# Patient Record
Sex: Female | Born: 1990 | Race: Black or African American | Hispanic: No | Marital: Single | Smoking: Former smoker
Health system: Southern US, Community
[De-identification: ages and names within clinical notes are randomized; demographics above are authoritative.]

## PROBLEM LIST (undated history)

## (undated) ENCOUNTER — Encounter
Payer: MEDICARE | Attending: Student in an Organized Health Care Education/Training Program | Primary: Student in an Organized Health Care Education/Training Program

## (undated) ENCOUNTER — Non-Acute Institutional Stay: Payer: MEDICARE | Attending: Family Medicine | Primary: Family Medicine

## (undated) ENCOUNTER — Telehealth

## (undated) ENCOUNTER — Encounter

## (undated) ENCOUNTER — Ambulatory Visit: Payer: MEDICARE | Attending: Registered" | Primary: Registered"

## (undated) ENCOUNTER — Encounter
Attending: Student in an Organized Health Care Education/Training Program | Primary: Student in an Organized Health Care Education/Training Program

## (undated) ENCOUNTER — Ambulatory Visit

## (undated) ENCOUNTER — Telehealth: Attending: Clinical | Primary: Clinical

## (undated) ENCOUNTER — Telehealth: Payer: MEDICARE

## (undated) ENCOUNTER — Ambulatory Visit: Payer: MEDICARE

## (undated) ENCOUNTER — Ambulatory Visit: Payer: Medicare (Managed Care)

## (undated) ENCOUNTER — Encounter: Payer: MEDICARE | Attending: Family | Primary: Family

## (undated) ENCOUNTER — Encounter: Attending: Family | Primary: Family

## (undated) ENCOUNTER — Telehealth
Attending: Student in an Organized Health Care Education/Training Program | Primary: Student in an Organized Health Care Education/Training Program

## (undated) ENCOUNTER — Encounter: Attending: Clinical | Primary: Clinical

## (undated) ENCOUNTER — Encounter: Payer: MEDICARE | Attending: Psychiatry | Primary: Psychiatry

## (undated) ENCOUNTER — Encounter: Payer: MEDICARE | Attending: Family Medicine | Primary: Family Medicine

## (undated) ENCOUNTER — Encounter: Attending: Psychiatry | Primary: Psychiatry

## (undated) ENCOUNTER — Non-Acute Institutional Stay
Payer: MEDICARE | Attending: Rehabilitative and Restorative Service Providers" | Primary: Rehabilitative and Restorative Service Providers"

## (undated) ENCOUNTER — Encounter: Payer: MEDICARE | Attending: Registered" | Primary: Registered"

## (undated) ENCOUNTER — Encounter: Payer: MEDICARE | Attending: Nutritionist | Primary: Nutritionist

## (undated) ENCOUNTER — Ambulatory Visit
Attending: Student in an Organized Health Care Education/Training Program | Primary: Student in an Organized Health Care Education/Training Program

## (undated) ENCOUNTER — Non-Acute Institutional Stay
Payer: MEDICARE | Attending: Student in an Organized Health Care Education/Training Program | Primary: Student in an Organized Health Care Education/Training Program

## (undated) ENCOUNTER — Encounter
Payer: MEDICARE | Attending: Rehabilitative and Restorative Service Providers" | Primary: Rehabilitative and Restorative Service Providers"

## (undated) ENCOUNTER — Ambulatory Visit: Attending: Pharmacist | Primary: Pharmacist

## (undated) ENCOUNTER — Telehealth: Attending: Psychiatry | Primary: Psychiatry

## (undated) ENCOUNTER — Ambulatory Visit: Attending: Clinical | Primary: Clinical

## (undated) ENCOUNTER — Encounter: Payer: MEDICARE | Attending: Obstetrics & Gynecology | Primary: Obstetrics & Gynecology

## (undated) ENCOUNTER — Telehealth: Attending: Family | Primary: Family

## (undated) ENCOUNTER — Encounter: Attending: Obstetrics & Gynecology | Primary: Obstetrics & Gynecology

## (undated) ENCOUNTER — Inpatient Hospital Stay

## (undated) ENCOUNTER — Telehealth: Attending: Family Medicine | Primary: Family Medicine

## (undated) ENCOUNTER — Ambulatory Visit
Payer: MEDICARE | Attending: Rehabilitative and Restorative Service Providers" | Primary: Rehabilitative and Restorative Service Providers"

## (undated) ENCOUNTER — Ambulatory Visit: Payer: MEDICARE | Attending: Sports Medicine | Primary: Sports Medicine

## (undated) ENCOUNTER — Ambulatory Visit
Payer: Medicare (Managed Care) | Attending: Student in an Organized Health Care Education/Training Program | Primary: Student in an Organized Health Care Education/Training Program

## (undated) ENCOUNTER — Ambulatory Visit: Payer: MEDICARE | Attending: Ophthalmology | Primary: Ophthalmology

## (undated) ENCOUNTER — Other Ambulatory Visit

## (undated) ENCOUNTER — Ambulatory Visit
Payer: MEDICARE | Attending: Student in an Organized Health Care Education/Training Program | Primary: Student in an Organized Health Care Education/Training Program

## (undated) ENCOUNTER — Encounter: Attending: Family Medicine | Primary: Family Medicine

## (undated) ENCOUNTER — Ambulatory Visit: Payer: MEDICARE | Attending: Family Medicine | Primary: Family Medicine

## (undated) ENCOUNTER — Ambulatory Visit: Payer: Medicare (Managed Care) | Attending: "Endocrinology | Primary: "Endocrinology

## (undated) ENCOUNTER — Encounter: Payer: MEDICARE | Attending: Sports Medicine | Primary: Sports Medicine

## (undated) DIAGNOSIS — H547 Unspecified visual loss: Secondary | ICD-10-CM

## (undated) DIAGNOSIS — E7030 Albinism, unspecified: Secondary | ICD-10-CM

## (undated) DIAGNOSIS — D573 Sickle-cell trait: Secondary | ICD-10-CM

## (undated) DIAGNOSIS — E119 Type 2 diabetes mellitus without complications: Secondary | ICD-10-CM

## (undated) MED ORDER — POLYETHYLENE GLYCOL 3350 17 GRAM/DOSE ORAL POWDER: 0 days

## (undated) MED ORDER — ONDANSETRON 4 MG DISINTEGRATING TABLET: 0 days

## (undated) MED ORDER — OMEPRAZOLE 40 MG CAPSULE,DELAYED RELEASE: 0 days

## (undated) MED ORDER — PRAZOSIN 1 MG CAPSULE: 0 days

## (undated) MED ORDER — METRONIDAZOLE 500 MG TABLET: 0 days

## (undated) MED ORDER — PRENATAL VITAMIN WITH MINERALS ORAL: Freq: Every day | ORAL | 0 days

## (undated) MED ORDER — FLUOXETINE 40 MG CAPSULE: Freq: Every day | ORAL | 0.00000 days | Status: SS

## (undated) MED ORDER — BISACODYL 10 MG RECTAL SUPPOSITORY: 0 days

## (undated) MED ORDER — MULTIVIT-MINERALS-IRON 45 MG-FOLIC ACID 800 MCG-VIT K 120 MCG CAPSULE: Freq: Every day | ORAL | 0 days

---
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
## 2011-05-04 DIAGNOSIS — H542X22 Low vision right eye category 2, low vision left eye category 2: Secondary | ICD-10-CM | POA: Insufficient documentation

## 2011-05-04 DIAGNOSIS — E70321 Tyrosinase positive oculocutaneous albinism: Secondary | ICD-10-CM | POA: Insufficient documentation

## 2011-05-04 DIAGNOSIS — H9042 Sensorineural hearing loss, unilateral, left ear, with unrestricted hearing on the contralateral side: Secondary | ICD-10-CM | POA: Insufficient documentation

## 2011-05-04 DIAGNOSIS — D573 Sickle-cell trait: Secondary | ICD-10-CM | POA: Insufficient documentation

## 2011-05-05 DIAGNOSIS — E7030 Albinism, unspecified: Secondary | ICD-10-CM | POA: Insufficient documentation

## 2011-05-05 DIAGNOSIS — E119 Type 2 diabetes mellitus without complications: Secondary | ICD-10-CM | POA: Insufficient documentation

## 2012-05-28 DIAGNOSIS — L600 Ingrowing nail: Secondary | ICD-10-CM | POA: Insufficient documentation

## 2013-09-11 DIAGNOSIS — F32A Depression, unspecified: Secondary | ICD-10-CM | POA: Insufficient documentation

## 2013-09-11 DIAGNOSIS — F419 Anxiety disorder, unspecified: Secondary | ICD-10-CM | POA: Insufficient documentation

## 2015-09-01 DIAGNOSIS — H5509 Other forms of nystagmus: Secondary | ICD-10-CM | POA: Insufficient documentation

## 2015-09-01 DIAGNOSIS — H5017 Alternating exotropia with V pattern: Secondary | ICD-10-CM | POA: Insufficient documentation

## 2015-10-22 DIAGNOSIS — G629 Polyneuropathy, unspecified: Secondary | ICD-10-CM | POA: Insufficient documentation

## 2015-11-15 DIAGNOSIS — K219 Gastro-esophageal reflux disease without esophagitis: Secondary | ICD-10-CM | POA: Insufficient documentation

## 2016-06-14 DIAGNOSIS — H548 Legal blindness, as defined in USA: Secondary | ICD-10-CM | POA: Insufficient documentation

## 2016-09-07 ENCOUNTER — Emergency Department
Admission: EM | Admit: 2016-09-07 | Discharge: 2016-09-07 | Disposition: A | Discharge status: Home / Self Care | Payer: MEDICAID | Source: Intra-hospital

## 2016-09-07 ENCOUNTER — Emergency Department
Admission: EM | Admit: 2016-09-07 | Discharge: 2016-09-07 | Disposition: A | Discharge status: Home / Self Care | Payer: MEDICAID | Source: Intra-hospital | Attending: Emergency Medicine | Admitting: Emergency Medicine

## 2016-09-07 DIAGNOSIS — R112 Nausea with vomiting, unspecified: Principal | ICD-10-CM

## 2016-12-18 ENCOUNTER — Emergency Department
Admission: EM | Admit: 2016-12-18 | Discharge: 2016-12-18 | Disposition: A | Discharge status: Home / Self Care | Payer: MEDICAID | Source: Intra-hospital | Attending: Family | Admitting: Family

## 2017-03-01 ENCOUNTER — Encounter
Admit: 2017-03-01 | Discharge: 2017-03-01 | Disposition: A | Discharge status: Home / Self Care | Payer: MEDICARE | Attending: Student in an Organized Health Care Education/Training Program

## 2017-03-01 DIAGNOSIS — S0990XA Unspecified injury of head, initial encounter: Principal | ICD-10-CM

## 2017-03-01 DIAGNOSIS — M25522 Pain in left elbow: Secondary | ICD-10-CM

## 2017-03-01 DIAGNOSIS — M542 Cervicalgia: Secondary | ICD-10-CM

## 2017-08-24 ENCOUNTER — Encounter: Admit: 2017-08-24 | Discharge: 2017-08-25 | Payer: MEDICARE | Attending: Family | Primary: Family

## 2017-08-24 DIAGNOSIS — M79674 Pain in right toe(s): Principal | ICD-10-CM

## 2017-08-27 ENCOUNTER — Encounter
Admit: 2017-08-27 | Discharge: 2017-08-28 | Payer: MEDICARE | Attending: Student in an Organized Health Care Education/Training Program | Primary: Student in an Organized Health Care Education/Training Program

## 2017-08-27 DIAGNOSIS — F322 Major depressive disorder, single episode, severe without psychotic features: Secondary | ICD-10-CM

## 2017-08-27 DIAGNOSIS — H548 Legal blindness, as defined in USA: Secondary | ICD-10-CM

## 2017-08-27 DIAGNOSIS — G629 Polyneuropathy, unspecified: Secondary | ICD-10-CM

## 2017-08-27 DIAGNOSIS — F330 Major depressive disorder, recurrent, mild: Secondary | ICD-10-CM

## 2017-08-27 DIAGNOSIS — Z0000 Encounter for general adult medical examination without abnormal findings: Secondary | ICD-10-CM

## 2017-08-27 DIAGNOSIS — K219 Gastro-esophageal reflux disease without esophagitis: Secondary | ICD-10-CM

## 2017-08-27 DIAGNOSIS — G43001 Migraine without aura, not intractable, with status migrainosus: Secondary | ICD-10-CM

## 2017-08-27 DIAGNOSIS — E0800 Diabetes mellitus due to underlying condition with hyperosmolarity without nonketotic hyperglycemic-hyperosmolar coma (NKHHC): Secondary | ICD-10-CM

## 2017-08-27 DIAGNOSIS — E119 Type 2 diabetes mellitus without complications: Principal | ICD-10-CM

## 2017-08-27 MED ORDER — RANITIDINE 150 MG TABLET
ORAL_TABLET | Freq: Two times a day (BID) | ORAL | 1 refills | 0.00000 days | Status: CP
Start: 2017-08-27 — End: 2017-08-27

## 2017-08-27 MED ORDER — RANITIDINE 150 MG TABLET: 150 mg | tablet | 1 refills | 0 days | Status: AC

## 2017-08-27 MED ORDER — METFORMIN ER 1,000 MG 24 HR TABLET,EXTENDED RELEASE
ORAL_TABLET | Freq: Two times a day (BID) | ORAL | 6 refills | 0.00000 days | Status: CP
Start: 2017-08-27 — End: 2017-10-05

## 2017-08-27 MED ORDER — GABAPENTIN 300 MG CAPSULE: 300 mg | capsule | 3 refills | 0 days

## 2017-08-27 MED ORDER — FLUOXETINE 20 MG TABLET: 60 mg | tablet | Freq: Every day | 3 refills | 0 days | Status: AC

## 2017-08-27 MED ORDER — SUMATRIPTAN 20 MG/ACTUATION NASAL SPRAY
NASAL | 0 refills | 0.00000 days | Status: CP | PRN
Start: 2017-08-27 — End: 2017-09-05

## 2017-08-27 MED ORDER — SUMATRIPTAN 20 MG/ACTUATION NASAL SPRAY: 1 | Inhaler | 0 refills | 0 days | Status: AC

## 2017-08-27 MED ORDER — METFORMIN ER 500 MG TABLET,EXTENDED RELEASE 24 HR
ORAL_TABLET | 6 refills | 0 days
Start: 2017-08-27 — End: 2017-10-05

## 2017-08-27 MED ORDER — GABAPENTIN 300 MG CAPSULE
ORAL_CAPSULE | Freq: Three times a day (TID) | ORAL | 3 refills | 0.00000 days | Status: CP
Start: 2017-08-27 — End: 2018-01-30

## 2017-08-27 MED ORDER — FLUOXETINE 20 MG TABLET
ORAL_TABLET | Freq: Every day | ORAL | 3 refills | 0.00000 days | Status: CP
Start: 2017-08-27 — End: 2017-08-27

## 2017-08-27 MED FILL — FLUOXETINE HCL/20MG/TABS: FLUOXETINE HCL/20MG/TABS | 10 days supply | Qty: 30 | Fill #0

## 2017-08-27 MED FILL — RANITIDINE HCL/150MG/TABS: RANITIDINE HCL/150MG/TABS | 30 days supply | Qty: 60 | Fill #0

## 2017-08-27 MED FILL — METFORMIN ER/500MG ER/TAB: METFORMIN ER/500MG ER/TAB | 30 days supply | Qty: 120 | Fill #0

## 2017-08-27 MED FILL — GABAPENTIN/300MG/CAPS: GABAPENTIN/300MG/CAPS | 30 days supply | Qty: 90 | Fill #0

## 2017-09-04 ENCOUNTER — Encounter
Admit: 2017-09-04 | Discharge: 2017-09-04 | Discharge status: Against Medical Advice | Payer: MEDICARE | Attending: Emergency Medicine

## 2017-09-04 ENCOUNTER — Ambulatory Visit
Admit: 2017-09-04 | Discharge: 2017-09-04 | Discharge status: Against Medical Advice | Payer: MEDICARE | Attending: Emergency Medicine

## 2017-09-04 DIAGNOSIS — N939 Abnormal uterine and vaginal bleeding, unspecified: Principal | ICD-10-CM

## 2017-09-04 DIAGNOSIS — H548 Legal blindness, as defined in USA: Secondary | ICD-10-CM

## 2017-09-04 DIAGNOSIS — Z01419 Encounter for gynecological examination (general) (routine) without abnormal findings: Secondary | ICD-10-CM

## 2017-09-04 DIAGNOSIS — Z3169 Encounter for other general counseling and advice on procreation: Secondary | ICD-10-CM

## 2017-09-04 DIAGNOSIS — E0800 Diabetes mellitus due to underlying condition with hyperosmolarity without nonketotic hyperglycemic-hyperosmolar coma (NKHHC): Secondary | ICD-10-CM

## 2017-09-04 MED ORDER — MEDROXYPROGESTERONE 10 MG TABLET: 10 mg | tablet | Freq: Every day | 3 refills | 0 days | Status: AC

## 2017-09-04 MED ORDER — MEDROXYPROGESTERONE 10 MG TABLET
ORAL_TABLET | Freq: Every day | ORAL | 3 refills | 0.00000 days | Status: CP
Start: 2017-09-04 — End: 2017-12-12

## 2017-09-04 MED FILL — MEDROXYPROGEST AC/10MG/TAB: MEDROXYPROGEST AC/10MG/TAB | 90 days supply | Qty: 90 | Fill #0

## 2017-09-05 MED ORDER — SUMATRIPTAN 100 MG TABLET
ORAL_TABLET | ORAL | 0 refills | 0.00000 days | Status: CP | PRN
Start: 2017-09-05 — End: 2017-09-05

## 2017-09-05 MED ORDER — SUMATRIPTAN 100 MG TABLET: tablet | 0 refills | 0 days | Status: AC

## 2017-09-12 ENCOUNTER — Encounter: Admit: 2017-09-12 | Discharge: 2017-09-13 | Payer: MEDICARE | Attending: Family | Primary: Family

## 2017-09-12 DIAGNOSIS — J0140 Acute pansinusitis, unspecified: Secondary | ICD-10-CM

## 2017-09-12 DIAGNOSIS — H66003 Acute suppurative otitis media without spontaneous rupture of ear drum, bilateral: Secondary | ICD-10-CM

## 2017-09-12 DIAGNOSIS — J029 Acute pharyngitis, unspecified: Principal | ICD-10-CM

## 2017-09-12 DIAGNOSIS — R509 Fever, unspecified: Secondary | ICD-10-CM

## 2017-09-12 MED ORDER — GUAIFENESIN ER 600 MG TABLET, EXTENDED RELEASE 12 HR
ORAL_TABLET | Freq: Two times a day (BID) | ORAL | 0 refills | 0 days | Status: CP
Start: 2017-09-12 — End: 2017-10-05

## 2017-09-12 MED ORDER — AMOXICILLIN 875 MG-POTASSIUM CLAVULANATE 125 MG TABLET: 1 | tablet | Freq: Two times a day (BID) | 0 refills | 0 days | Status: AC

## 2017-09-12 MED ORDER — FLUTICASONE PROPIONATE 50 MCG/ACTUATION NASAL SPRAY,SUSPENSION: 1 | g | Freq: Two times a day (BID) | 11 refills | 0 days | Status: AC

## 2017-09-12 MED ORDER — FLUTICASONE PROPIONATE 50 MCG/ACTUATION NASAL SPRAY,SUSPENSION
Freq: Two times a day (BID) | NASAL | 11 refills | 0.00000 days | Status: CP | PRN
Start: 2017-09-12 — End: 2018-10-08

## 2017-09-12 MED ORDER — AMOXICILLIN 875 MG-POTASSIUM CLAVULANATE 125 MG TABLET
ORAL_TABLET | Freq: Two times a day (BID) | ORAL | 0 refills | 0.00000 days | Status: CP
Start: 2017-09-12 — End: 2017-10-05

## 2017-09-12 MED FILL — AMOXICILLIN/CLAVULANATE P/875/125MG/TABS: AMOXICILLIN/CLAVULANATE P/875/125MG/TABS | 7 days supply | Qty: 14 | Fill #0

## 2017-09-12 MED FILL — FLUTICASONE/50MCG/ACT/SUSP: FLUTICASONE/50MCG/ACT/SUSP | 30 days supply | Qty: 1 | Fill #0

## 2017-09-25 ENCOUNTER — Encounter: Admit: 2017-09-25 | Discharge: 2017-09-26 | Payer: MEDICARE | Attending: Community Health | Primary: Community Health

## 2017-09-25 ENCOUNTER — Ambulatory Visit: Admit: 2017-09-25 | Discharge: 2017-09-26 | Payer: MEDICARE | Attending: Psychiatry | Primary: Psychiatry

## 2017-09-25 DIAGNOSIS — F330 Major depressive disorder, recurrent, mild: Principal | ICD-10-CM

## 2017-09-25 DIAGNOSIS — S99821A Other specified injuries of right foot, initial encounter: Principal | ICD-10-CM

## 2017-09-25 DIAGNOSIS — F39 Unspecified mood [affective] disorder: Secondary | ICD-10-CM

## 2017-09-25 DIAGNOSIS — F4310 Post-traumatic stress disorder, unspecified: Secondary | ICD-10-CM

## 2017-09-25 MED ORDER — LAMOTRIGINE 25 MG TABLET
ORAL_TABLET | 3 refills | 0 days | Status: CP
Start: 2017-09-25 — End: 2018-03-22

## 2017-09-25 MED ORDER — LAMOTRIGINE 25 MG CHEWABLE DISPERSIBLE TABLET
3 refills | 0 days | Status: CP
Start: 2017-09-25 — End: 2018-03-22

## 2017-09-26 ENCOUNTER — Other Ambulatory Visit: Admit: 2017-09-26 | Discharge: 2017-09-27 | Payer: MEDICARE

## 2017-09-26 DIAGNOSIS — E7030 Albinism, unspecified: Secondary | ICD-10-CM

## 2017-09-26 DIAGNOSIS — E0800 Diabetes mellitus due to underlying condition with hyperosmolarity without nonketotic hyperglycemic-hyperosmolar coma (NKHHC): Secondary | ICD-10-CM

## 2017-09-26 DIAGNOSIS — N939 Abnormal uterine and vaginal bleeding, unspecified: Principal | ICD-10-CM

## 2017-09-26 DIAGNOSIS — E70319 Ocular albinism, unspecified: Secondary | ICD-10-CM

## 2017-10-05 ENCOUNTER — Encounter
Admit: 2017-10-05 | Discharge: 2017-10-06 | Payer: MEDICARE | Attending: Student in an Organized Health Care Education/Training Program | Primary: Student in an Organized Health Care Education/Training Program

## 2017-10-05 ENCOUNTER — Encounter: Admit: 2017-10-05 | Discharge: 2017-10-06 | Payer: MEDICARE | Attending: MS" | Primary: MS"

## 2017-10-05 DIAGNOSIS — F4310 Post-traumatic stress disorder, unspecified: Secondary | ICD-10-CM

## 2017-10-05 DIAGNOSIS — Z6841 Body Mass Index (BMI) 40.0 and over, adult: Secondary | ICD-10-CM

## 2017-10-05 DIAGNOSIS — E70319 Ocular albinism, unspecified: Principal | ICD-10-CM

## 2017-10-05 DIAGNOSIS — Z315 Encounter for genetic counseling: Secondary | ICD-10-CM

## 2017-10-05 DIAGNOSIS — Z8489 Family history of other specified conditions: Secondary | ICD-10-CM

## 2017-10-05 DIAGNOSIS — F322 Major depressive disorder, single episode, severe without psychotic features: Secondary | ICD-10-CM

## 2017-10-05 DIAGNOSIS — E0800 Diabetes mellitus due to underlying condition with hyperosmolarity without nonketotic hyperglycemic-hyperosmolar coma (NKHHC): Principal | ICD-10-CM

## 2017-10-05 MED ORDER — METFORMIN 500 MG TABLET
ORAL_TABLET | Freq: Two times a day (BID) | ORAL | 11 refills | 0 days | Status: CP
Start: 2017-10-05 — End: 2018-03-08

## 2017-10-05 MED ORDER — BLOOD-GLUCOSE METER KIT
11 refills | 0 days | Status: CP
Start: 2017-10-05 — End: 2018-04-15

## 2017-10-26 MED ORDER — LANCETS 31 GAUGE
Freq: Four times a day (QID) | 3 refills | 0 days | Status: CP
Start: 2017-10-26 — End: ?

## 2017-10-26 MED ORDER — BLOOD SUGAR DIAGNOSTIC STRIPS
Freq: Four times a day (QID) | 2 refills | 0.00000 days | Status: CP
Start: 2017-10-26 — End: 2018-04-15

## 2017-11-09 ENCOUNTER — Encounter
Admit: 2017-11-09 | Discharge: 2017-11-10 | Payer: MEDICARE | Attending: Student in an Organized Health Care Education/Training Program | Primary: Student in an Organized Health Care Education/Training Program

## 2017-11-09 DIAGNOSIS — M549 Dorsalgia, unspecified: Secondary | ICD-10-CM

## 2017-11-09 DIAGNOSIS — G8929 Other chronic pain: Secondary | ICD-10-CM

## 2017-11-09 DIAGNOSIS — Z6841 Body Mass Index (BMI) 40.0 and over, adult: Secondary | ICD-10-CM

## 2017-11-09 DIAGNOSIS — E139 Other specified diabetes mellitus without complications: Principal | ICD-10-CM

## 2017-11-09 MED ORDER — CYCLOBENZAPRINE 5 MG TABLET
ORAL_TABLET | Freq: Three times a day (TID) | ORAL | 0 refills | 0 days | Status: CP | PRN
Start: 2017-11-09 — End: 2017-12-09

## 2017-11-09 MED ORDER — NAPROXEN 500 MG TABLET
ORAL_TABLET | Freq: Two times a day (BID) | ORAL | 2 refills | 0.00000 days | Status: CP
Start: 2017-11-09 — End: 2018-03-08

## 2017-11-23 ENCOUNTER — Encounter: Admit: 2017-11-23 | Discharge: 2017-11-23 | Payer: MEDICARE

## 2017-11-23 DIAGNOSIS — R112 Nausea with vomiting, unspecified: Secondary | ICD-10-CM

## 2017-11-23 DIAGNOSIS — R300 Dysuria: Secondary | ICD-10-CM

## 2017-11-23 DIAGNOSIS — K529 Noninfective gastroenteritis and colitis, unspecified: Principal | ICD-10-CM

## 2017-11-23 MED ORDER — PROMETHAZINE 25 MG TABLET
ORAL_TABLET | Freq: Four times a day (QID) | ORAL | 0 refills | 0.00000 days | Status: CP | PRN
Start: 2017-11-23 — End: 2018-03-08

## 2017-11-23 MED ORDER — ONDANSETRON HCL 4 MG TABLET
ORAL_TABLET | Freq: Two times a day (BID) | ORAL | 1 refills | 0 days | Status: CP | PRN
Start: 2017-11-23 — End: ?

## 2017-12-12 ENCOUNTER — Encounter: Admit: 2017-12-12 | Discharge: 2017-12-13 | Payer: MEDICARE | Attending: Family Medicine | Primary: Family Medicine

## 2017-12-12 ENCOUNTER — Encounter
Admit: 2017-12-12 | Discharge: 2017-12-12 | Disposition: A | Discharge status: Home / Self Care | Payer: MEDICARE | Attending: Family

## 2017-12-12 DIAGNOSIS — K529 Noninfective gastroenteritis and colitis, unspecified: Principal | ICD-10-CM

## 2017-12-12 MED ORDER — MEDROXYPROGESTERONE 10 MG TABLET
ORAL_TABLET | 0 refills | 0 days | Status: CP
Start: 2017-12-12 — End: ?

## 2017-12-14 DIAGNOSIS — H5213 Myopia, bilateral: Secondary | ICD-10-CM | POA: Insufficient documentation

## 2018-01-07 ENCOUNTER — Encounter: Admit: 2018-01-07 | Discharge: 2018-01-07 | Payer: MEDICARE | Attending: Registered" | Primary: Registered"

## 2018-01-07 ENCOUNTER — Encounter: Admit: 2018-01-07 | Discharge: 2018-01-07 | Payer: MEDICARE | Attending: Family | Primary: Family

## 2018-01-07 DIAGNOSIS — Z6841 Body Mass Index (BMI) 40.0 and over, adult: Secondary | ICD-10-CM

## 2018-01-07 DIAGNOSIS — E119 Type 2 diabetes mellitus without complications: Secondary | ICD-10-CM

## 2018-01-07 DIAGNOSIS — Z0389 Encounter for observation for other suspected diseases and conditions ruled out: Secondary | ICD-10-CM

## 2018-01-07 DIAGNOSIS — R0683 Snoring: Secondary | ICD-10-CM

## 2018-01-07 DIAGNOSIS — K219 Gastro-esophageal reflux disease without esophagitis: Secondary | ICD-10-CM

## 2018-01-07 DIAGNOSIS — Z9189 Other specified personal risk factors, not elsewhere classified: Secondary | ICD-10-CM

## 2018-01-12 ENCOUNTER — Encounter: Admit: 2018-01-12 | Discharge: 2018-01-14 | Payer: MEDICARE

## 2018-01-12 DIAGNOSIS — Z6841 Body Mass Index (BMI) 40.0 and over, adult: Secondary | ICD-10-CM

## 2018-01-12 DIAGNOSIS — R0683 Snoring: Secondary | ICD-10-CM

## 2018-01-12 DIAGNOSIS — Z9189 Other specified personal risk factors, not elsewhere classified: Secondary | ICD-10-CM

## 2018-01-30 ENCOUNTER — Encounter: Admit: 2018-01-30 | Discharge: 2018-01-31 | Payer: MEDICARE | Attending: Family | Primary: Family

## 2018-01-30 DIAGNOSIS — G629 Polyneuropathy, unspecified: Principal | ICD-10-CM

## 2018-01-30 MED ORDER — GABAPENTIN 300 MG CAPSULE
ORAL_CAPSULE | Freq: Three times a day (TID) | ORAL | 0 refills | 0 days | Status: CP
Start: 2018-01-30 — End: ?

## 2018-02-08 MED ORDER — FLUOXETINE 20 MG TABLET
ORAL_TABLET | Freq: Every day | ORAL | 3 refills | 0.00000 days | Status: CP
Start: 2018-02-08 — End: 2018-02-21

## 2018-02-21 MED ORDER — FLUOXETINE 20 MG CAPSULE
ORAL_CAPSULE | Freq: Every day | ORAL | 2 refills | 0 days | Status: CP
Start: 2018-02-21 — End: 2018-05-16

## 2018-02-22 ENCOUNTER — Encounter: Admit: 2018-02-22 | Discharge: 2018-02-23 | Payer: MEDICARE | Attending: Family | Primary: Family

## 2018-02-22 DIAGNOSIS — R0683 Snoring: Secondary | ICD-10-CM

## 2018-02-22 DIAGNOSIS — E119 Type 2 diabetes mellitus without complications: Secondary | ICD-10-CM

## 2018-02-22 DIAGNOSIS — Z6841 Body Mass Index (BMI) 40.0 and over, adult: Secondary | ICD-10-CM

## 2018-02-22 DIAGNOSIS — Z9189 Other specified personal risk factors, not elsewhere classified: Secondary | ICD-10-CM

## 2018-02-22 DIAGNOSIS — K219 Gastro-esophageal reflux disease without esophagitis: Secondary | ICD-10-CM

## 2018-02-27 HISTORY — PX: BARIATRIC SURGERY: SHX1103

## 2018-03-01 ENCOUNTER — Encounter: Admit: 2018-03-01 | Discharge: 2018-03-02 | Payer: MEDICARE | Attending: Registered" | Primary: Registered"

## 2018-03-01 DIAGNOSIS — Z6841 Body Mass Index (BMI) 40.0 and over, adult: Secondary | ICD-10-CM

## 2018-03-06 ENCOUNTER — Encounter
Admit: 2018-03-06 | Discharge: 2018-03-06 | Payer: MEDICARE | Attending: Certified Registered" | Primary: Certified Registered"

## 2018-03-06 ENCOUNTER — Encounter: Admit: 2018-03-06 | Discharge: 2018-03-06 | Payer: MEDICARE

## 2018-03-06 DIAGNOSIS — Z01818 Encounter for other preprocedural examination: Principal | ICD-10-CM

## 2018-03-08 ENCOUNTER — Ambulatory Visit: Admit: 2018-03-08 | Discharge: 2018-03-09 | Payer: MEDICARE

## 2018-03-08 ENCOUNTER — Ambulatory Visit: Admit: 2018-03-08 | Discharge: 2018-03-09 | Payer: MEDICARE | Attending: Family Medicine | Primary: Family Medicine

## 2018-03-08 ENCOUNTER — Encounter: Admit: 2018-03-08 | Discharge: 2018-03-09 | Payer: MEDICARE | Attending: Family Medicine | Primary: Family Medicine

## 2018-03-08 ENCOUNTER — Encounter: Admit: 2018-03-08 | Discharge: 2018-03-09 | Payer: MEDICARE

## 2018-03-08 DIAGNOSIS — E559 Vitamin D deficiency, unspecified: Secondary | ICD-10-CM

## 2018-03-08 DIAGNOSIS — E0800 Diabetes mellitus due to underlying condition with hyperosmolarity without nonketotic hyperglycemic-hyperosmolar coma (NKHHC): Secondary | ICD-10-CM

## 2018-03-08 DIAGNOSIS — R0789 Other chest pain: Principal | ICD-10-CM

## 2018-03-08 MED ORDER — ERGOCALCIFEROL (VITAMIN D2) 1,250 MCG (50,000 UNIT) CAPSULE
ORAL_CAPSULE | ORAL | 1 refills | 0 days | Status: CP
Start: 2018-03-08 — End: 2019-03-08

## 2018-03-09 MED ORDER — METFORMIN 1,000 MG TABLET
ORAL_TABLET | Freq: Two times a day (BID) | ORAL | 11 refills | 0.00000 days | Status: CP
Start: 2018-03-09 — End: 2018-04-15

## 2018-03-14 ENCOUNTER — Ambulatory Visit: Admit: 2018-03-14 | Discharge: 2018-03-15 | Payer: MEDICARE | Attending: Clinical | Primary: Clinical

## 2018-03-14 DIAGNOSIS — Z6841 Body Mass Index (BMI) 40.0 and over, adult: Secondary | ICD-10-CM

## 2018-03-14 DIAGNOSIS — F54 Psychological and behavioral factors associated with disorders or diseases classified elsewhere: Secondary | ICD-10-CM

## 2018-03-15 ENCOUNTER — Ambulatory Visit: Admit: 2018-03-15 | Discharge: 2018-03-15 | Payer: MEDICARE | Attending: Family | Primary: Family

## 2018-03-15 ENCOUNTER — Encounter
Admit: 2018-03-15 | Discharge: 2018-03-15 | Payer: MEDICARE | Attending: Student in an Organized Health Care Education/Training Program | Primary: Student in an Organized Health Care Education/Training Program

## 2018-03-15 ENCOUNTER — Encounter: Admit: 2018-03-15 | Discharge: 2018-03-15 | Payer: MEDICARE

## 2018-03-15 DIAGNOSIS — E119 Type 2 diabetes mellitus without complications: Secondary | ICD-10-CM

## 2018-03-15 DIAGNOSIS — Z6841 Body Mass Index (BMI) 40.0 and over, adult: Secondary | ICD-10-CM

## 2018-03-15 DIAGNOSIS — Z7189 Other specified counseling: Secondary | ICD-10-CM

## 2018-03-15 DIAGNOSIS — M25579 Pain in unspecified ankle and joints of unspecified foot: Principal | ICD-10-CM

## 2018-03-15 DIAGNOSIS — K219 Gastro-esophageal reflux disease without esophagitis: Secondary | ICD-10-CM

## 2018-03-15 DIAGNOSIS — M25571 Pain in right ankle and joints of right foot: Principal | ICD-10-CM

## 2018-03-15 DIAGNOSIS — H548 Legal blindness, as defined in USA: Secondary | ICD-10-CM

## 2018-03-18 ENCOUNTER — Ambulatory Visit: Admit: 2018-03-18 | Discharge: 2018-03-18 | Disposition: A | Discharge status: Home / Self Care | Payer: MEDICARE

## 2018-03-22 ENCOUNTER — Encounter: Admit: 2018-03-22 | Discharge: 2018-03-23 | Payer: MEDICARE | Attending: Psychiatry | Primary: Psychiatry

## 2018-03-22 DIAGNOSIS — F4310 Post-traumatic stress disorder, unspecified: Principal | ICD-10-CM

## 2018-03-22 DIAGNOSIS — F39 Unspecified mood [affective] disorder: Secondary | ICD-10-CM

## 2018-03-22 DIAGNOSIS — F330 Major depressive disorder, recurrent, mild: Secondary | ICD-10-CM

## 2018-03-22 MED ORDER — TRAZODONE 50 MG TABLET
ORAL_TABLET | 3 refills | 0 days | Status: CP
Start: 2018-03-22 — End: ?

## 2018-03-22 MED ORDER — ARIPIPRAZOLE 10 MG TABLET
ORAL_TABLET | 0 refills | 0 days | Status: CP
Start: 2018-03-22 — End: 2018-07-19

## 2018-04-10 ENCOUNTER — Ambulatory Visit: Admit: 2018-04-10 | Discharge: 2018-04-11 | Payer: MEDICARE | Attending: Family | Primary: Family

## 2018-04-10 DIAGNOSIS — G43019 Migraine without aura, intractable, without status migrainosus: Principal | ICD-10-CM

## 2018-04-10 MED ORDER — SUMATRIPTAN 6 MG/0.5 ML SUBCUTANEOUS SOLUTION
Freq: Once | SUBCUTANEOUS | 0 refills | 0 days | Status: CP
Start: 2018-04-10 — End: 2018-04-10

## 2018-04-10 MED ORDER — SUMATRIPTAN 100 MG TABLET
ORAL_TABLET | ORAL | 0 refills | 0 days | Status: CP | PRN
Start: 2018-04-10 — End: 2018-05-10
  Filled 2018-04-10: qty 9, 23d supply, fill #0

## 2018-04-10 MED FILL — SUMATRIPTAN 100 MG TABLET: 23 days supply | Qty: 9 | Fill #0 | Status: AC

## 2018-04-12 ENCOUNTER — Encounter: Admit: 2018-04-12 | Discharge: 2018-04-13 | Payer: MEDICARE | Attending: Registered" | Primary: Registered"

## 2018-04-12 DIAGNOSIS — Z6841 Body Mass Index (BMI) 40.0 and over, adult: Secondary | ICD-10-CM

## 2018-04-15 ENCOUNTER — Encounter
Admit: 2018-04-15 | Discharge: 2018-04-16 | Payer: MEDICARE | Attending: Student in an Organized Health Care Education/Training Program | Primary: Student in an Organized Health Care Education/Training Program

## 2018-04-15 DIAGNOSIS — G8929 Other chronic pain: Secondary | ICD-10-CM

## 2018-04-15 DIAGNOSIS — M5441 Lumbago with sciatica, right side: Secondary | ICD-10-CM

## 2018-04-15 DIAGNOSIS — Z6841 Body Mass Index (BMI) 40.0 and over, adult: Principal | ICD-10-CM

## 2018-04-15 DIAGNOSIS — E0800 Diabetes mellitus due to underlying condition with hyperosmolarity without nonketotic hyperglycemic-hyperosmolar coma (NKHHC): Secondary | ICD-10-CM

## 2018-04-15 DIAGNOSIS — M23329 Other meniscus derangements, posterior horn of medial meniscus, unspecified knee: Secondary | ICD-10-CM | POA: Insufficient documentation

## 2018-04-15 MED ORDER — ACCU-CHEK GUIDE TEST STRIPS
Freq: Four times a day (QID) | 2 refills | 0.00000 days | Status: CP
Start: 2018-04-15 — End: ?
  Filled 2018-04-15: qty 200, 50d supply, fill #0

## 2018-04-15 MED ORDER — SENNOSIDES 8.6 MG-DOCUSATE SODIUM 50 MG TABLET
ORAL_TABLET | Freq: Every day | ORAL | 2 refills | 0.00000 days | Status: CP
Start: 2018-04-15 — End: 2019-04-15
  Filled 2018-04-16: qty 30, 30d supply, fill #0

## 2018-04-15 MED ORDER — ON CALL EXPRESS TEST STRIP
2 refills | 0 days
Start: 2018-04-15 — End: ?

## 2018-04-15 MED ORDER — BLOOD-GLUCOSE METER
11 refills | 0 days | Status: CP
Start: 2018-04-15 — End: 2019-04-15
  Filled 2018-04-15: qty 1, 1d supply, fill #0

## 2018-04-15 MED ORDER — METFORMIN 1,000 MG TABLET
ORAL_TABLET | Freq: Two times a day (BID) | ORAL | 11 refills | 0.00000 days | Status: CP
Start: 2018-04-15 — End: 2019-04-15
  Filled 2018-04-15: qty 60, 30d supply, fill #0

## 2018-04-15 MED ORDER — LIRAGLUTIDE 0.6 MG/0.1 ML (18 MG/3 ML) SUBCUTANEOUS PEN INJECTOR
1 refills | 0 days | Status: CP
Start: 2018-04-15 — End: 2018-06-14
  Filled 2018-04-15: qty 6, 27d supply, fill #0

## 2018-04-15 MED ORDER — LIDOCAINE 5 % TOPICAL PATCH
MEDICATED_PATCH | Freq: Two times a day (BID) | TRANSDERMAL | 0 refills | 0.00000 days | Status: CP
Start: 2018-04-15 — End: 2019-04-15
  Filled 2018-04-15: qty 10, 10d supply, fill #0

## 2018-04-15 MED FILL — ON CALL EXPRESS METER: 1 days supply | Qty: 1 | Fill #0 | Status: AC

## 2018-04-15 MED FILL — LIDOCAINE 5 % TOPICAL PATCH: 10 days supply | Qty: 10 | Fill #0 | Status: AC

## 2018-04-15 MED FILL — ON CALL EXPRESS TEST STRIP: 50 days supply | Qty: 200 | Fill #0 | Status: AC

## 2018-04-15 MED FILL — METFORMIN 1,000 MG TABLET: 30 days supply | Qty: 60 | Fill #0 | Status: AC

## 2018-04-15 MED FILL — VICTOZA 2-PAK 0.6 MG/0.1 ML (18 MG/3 ML) SUBCUTANEOUS PEN INJECTOR: 27 days supply | Qty: 6 | Fill #0 | Status: AC

## 2018-04-16 MED FILL — COLACE 2-IN-1 8.6 MG-50 MG TABLET: 30 days supply | Qty: 30 | Fill #0 | Status: AC

## 2018-05-03 ENCOUNTER — Encounter
Admit: 2018-05-03 | Discharge: 2018-05-04 | Payer: MEDICARE | Attending: Physician Assistant | Primary: Physician Assistant

## 2018-05-03 DIAGNOSIS — K219 Gastro-esophageal reflux disease without esophagitis: Principal | ICD-10-CM

## 2018-05-03 DIAGNOSIS — E7030 Albinism, unspecified: Principal | ICD-10-CM

## 2018-05-03 DIAGNOSIS — D573 Sickle-cell trait: Principal | ICD-10-CM

## 2018-05-03 DIAGNOSIS — H547 Unspecified visual loss: Principal | ICD-10-CM

## 2018-05-03 DIAGNOSIS — N809 Endometriosis, unspecified: Principal | ICD-10-CM

## 2018-05-03 DIAGNOSIS — F329 Major depressive disorder, single episode, unspecified: Principal | ICD-10-CM

## 2018-05-03 DIAGNOSIS — N979 Female infertility, unspecified: Principal | ICD-10-CM

## 2018-05-03 DIAGNOSIS — Z6841 Body Mass Index (BMI) 40.0 and over, adult: Principal | ICD-10-CM

## 2018-05-03 DIAGNOSIS — E538 Deficiency of other specified B group vitamins: Principal | ICD-10-CM

## 2018-05-03 DIAGNOSIS — E119 Type 2 diabetes mellitus without complications: Principal | ICD-10-CM

## 2018-05-03 DIAGNOSIS — H9042 Sensorineural hearing loss, unilateral, left ear, with unrestricted hearing on the contralateral side: Principal | ICD-10-CM

## 2018-05-03 DIAGNOSIS — G43909 Migraine, unspecified, not intractable, without status migrainosus: Principal | ICD-10-CM

## 2018-05-03 DIAGNOSIS — I10 Essential (primary) hypertension: Principal | ICD-10-CM

## 2018-05-03 DIAGNOSIS — F419 Anxiety disorder, unspecified: Principal | ICD-10-CM

## 2018-05-08 ENCOUNTER — Encounter: Admit: 2018-05-08 | Discharge: 2018-05-08 | Disposition: A | Discharge status: Home / Self Care | Payer: MEDICARE

## 2018-05-08 DIAGNOSIS — F329 Major depressive disorder, single episode, unspecified: Principal | ICD-10-CM

## 2018-05-08 DIAGNOSIS — F419 Anxiety disorder, unspecified: Principal | ICD-10-CM

## 2018-05-08 DIAGNOSIS — H9190 Unspecified hearing loss, unspecified ear: Principal | ICD-10-CM

## 2018-05-08 DIAGNOSIS — F17210 Nicotine dependence, cigarettes, uncomplicated: Principal | ICD-10-CM

## 2018-05-08 DIAGNOSIS — G43909 Migraine, unspecified, not intractable, without status migrainosus: Principal | ICD-10-CM

## 2018-05-08 DIAGNOSIS — E119 Type 2 diabetes mellitus without complications: Principal | ICD-10-CM

## 2018-05-08 DIAGNOSIS — R55 Syncope and collapse: Principal | ICD-10-CM

## 2018-05-08 DIAGNOSIS — Z841 Family history of disorders of kidney and ureter: Principal | ICD-10-CM

## 2018-05-08 DIAGNOSIS — H539 Unspecified visual disturbance: Principal | ICD-10-CM

## 2018-05-08 DIAGNOSIS — Z885 Allergy status to narcotic agent status: Principal | ICD-10-CM

## 2018-05-08 DIAGNOSIS — Z6841 Body Mass Index (BMI) 40.0 and over, adult: Principal | ICD-10-CM

## 2018-05-08 DIAGNOSIS — E7030 Albinism, unspecified: Principal | ICD-10-CM

## 2018-05-08 DIAGNOSIS — Z7984 Long term (current) use of oral hypoglycemic drugs: Principal | ICD-10-CM

## 2018-05-08 DIAGNOSIS — Z8249 Family history of ischemic heart disease and other diseases of the circulatory system: Principal | ICD-10-CM

## 2018-05-08 DIAGNOSIS — K219 Gastro-esophageal reflux disease without esophagitis: Principal | ICD-10-CM

## 2018-05-08 DIAGNOSIS — H9042 Sensorineural hearing loss, unilateral, left ear, with unrestricted hearing on the contralateral side: Principal | ICD-10-CM

## 2018-05-08 DIAGNOSIS — I10 Essential (primary) hypertension: Principal | ICD-10-CM

## 2018-05-08 DIAGNOSIS — D573 Sickle-cell trait: Principal | ICD-10-CM

## 2018-05-08 DIAGNOSIS — N979 Female infertility, unspecified: Principal | ICD-10-CM

## 2018-05-08 DIAGNOSIS — E538 Deficiency of other specified B group vitamins: Principal | ICD-10-CM

## 2018-05-08 DIAGNOSIS — R079 Chest pain, unspecified: Principal | ICD-10-CM

## 2018-05-08 DIAGNOSIS — H547 Unspecified visual loss: Principal | ICD-10-CM

## 2018-05-08 DIAGNOSIS — N809 Endometriosis, unspecified: Principal | ICD-10-CM

## 2018-05-08 DIAGNOSIS — Z833 Family history of diabetes mellitus: Principal | ICD-10-CM

## 2018-05-09 DIAGNOSIS — R55 Syncope and collapse: Principal | ICD-10-CM

## 2018-05-16 MED ORDER — FLUOXETINE 20 MG CAPSULE
ORAL_CAPSULE | 3 refills | 0 days | Status: CP
Start: 2018-05-16 — End: 2018-10-04

## 2018-05-17 ENCOUNTER — Ambulatory Visit: Admit: 2018-05-17 | Discharge: 2018-05-18 | Payer: MEDICARE | Attending: Family | Primary: Family

## 2018-05-17 DIAGNOSIS — E538 Deficiency of other specified B group vitamins: Principal | ICD-10-CM

## 2018-05-17 DIAGNOSIS — Z6841 Body Mass Index (BMI) 40.0 and over, adult: Principal | ICD-10-CM

## 2018-05-17 DIAGNOSIS — N979 Female infertility, unspecified: Principal | ICD-10-CM

## 2018-05-17 DIAGNOSIS — Z7189 Other specified counseling: Principal | ICD-10-CM

## 2018-05-17 DIAGNOSIS — F329 Major depressive disorder, single episode, unspecified: Principal | ICD-10-CM

## 2018-05-17 DIAGNOSIS — E119 Type 2 diabetes mellitus without complications: Principal | ICD-10-CM

## 2018-05-17 DIAGNOSIS — D573 Sickle-cell trait: Principal | ICD-10-CM

## 2018-05-17 DIAGNOSIS — Z0389 Encounter for observation for other suspected diseases and conditions ruled out: Principal | ICD-10-CM

## 2018-05-17 DIAGNOSIS — K219 Gastro-esophageal reflux disease without esophagitis: Principal | ICD-10-CM

## 2018-05-17 DIAGNOSIS — F419 Anxiety disorder, unspecified: Principal | ICD-10-CM

## 2018-05-17 DIAGNOSIS — E7030 Albinism, unspecified: Principal | ICD-10-CM

## 2018-05-17 DIAGNOSIS — N809 Endometriosis, unspecified: Principal | ICD-10-CM

## 2018-05-17 DIAGNOSIS — H9042 Sensorineural hearing loss, unilateral, left ear, with unrestricted hearing on the contralateral side: Principal | ICD-10-CM

## 2018-05-17 DIAGNOSIS — G43909 Migraine, unspecified, not intractable, without status migrainosus: Principal | ICD-10-CM

## 2018-05-17 DIAGNOSIS — H547 Unspecified visual loss: Principal | ICD-10-CM

## 2018-05-17 MED ORDER — FAMOTIDINE 20 MG TABLET
ORAL_TABLET | Freq: Two times a day (BID) | ORAL | 3 refills | 0 days | Status: CP
Start: 2018-05-17 — End: 2019-05-17

## 2018-06-05 ENCOUNTER — Encounter: Admit: 2018-06-05 | Discharge: 2018-06-06 | Disposition: A | Discharge status: Home / Self Care | Payer: MEDICARE

## 2018-06-14 ENCOUNTER — Encounter
Admit: 2018-06-14 | Discharge: 2018-06-15 | Payer: MEDICARE | Attending: Student in an Organized Health Care Education/Training Program | Primary: Student in an Organized Health Care Education/Training Program

## 2018-06-14 DIAGNOSIS — G8929 Other chronic pain: Secondary | ICD-10-CM

## 2018-06-14 DIAGNOSIS — M5441 Lumbago with sciatica, right side: Secondary | ICD-10-CM

## 2018-06-14 DIAGNOSIS — R55 Syncope and collapse: Secondary | ICD-10-CM

## 2018-06-14 DIAGNOSIS — E0800 Diabetes mellitus due to underlying condition with hyperosmolarity without nonketotic hyperglycemic-hyperosmolar coma (NKHHC): Principal | ICD-10-CM

## 2018-06-14 MED ORDER — LIRAGLUTIDE 0.6 MG/0.1 ML (18 MG/3 ML) SUBCUTANEOUS PEN INJECTOR
Freq: Every day | SUBCUTANEOUS | 0 refills | 0.00000 days | Status: CP
Start: 2018-06-14 — End: 2018-09-25

## 2018-06-14 MED ORDER — CYCLOBENZAPRINE 5 MG TABLET
ORAL_TABLET | Freq: Three times a day (TID) | ORAL | 0 refills | 0 days | Status: CP | PRN
Start: 2018-06-14 — End: 2018-07-19

## 2018-06-21 ENCOUNTER — Encounter: Admit: 2018-06-21 | Discharge: 2018-06-22 | Payer: MEDICARE | Attending: Family | Primary: Family

## 2018-06-21 DIAGNOSIS — Z6841 Body Mass Index (BMI) 40.0 and over, adult: Principal | ICD-10-CM

## 2018-06-21 DIAGNOSIS — E119 Type 2 diabetes mellitus without complications: Secondary | ICD-10-CM

## 2018-06-21 DIAGNOSIS — Z7189 Other specified counseling: Secondary | ICD-10-CM

## 2018-07-09 ENCOUNTER — Encounter
Admit: 2018-07-09 | Discharge: 2018-07-09 | Payer: MEDICARE | Attending: Nurse Practitioner | Primary: Nurse Practitioner

## 2018-07-09 ENCOUNTER — Encounter: Admit: 2018-07-09 | Discharge: 2018-07-09 | Payer: MEDICARE

## 2018-07-09 DIAGNOSIS — R55 Syncope and collapse: Principal | ICD-10-CM

## 2018-07-09 DIAGNOSIS — R079 Chest pain, unspecified: Principal | ICD-10-CM

## 2018-07-09 DIAGNOSIS — I10 Essential (primary) hypertension: Secondary | ICD-10-CM

## 2018-07-17 ENCOUNTER — Encounter: Admit: 2018-07-17 | Discharge: 2018-07-18 | Payer: MEDICARE

## 2018-07-17 DIAGNOSIS — R079 Chest pain, unspecified: Principal | ICD-10-CM

## 2018-07-19 ENCOUNTER — Encounter: Admit: 2018-07-19 | Discharge: 2018-07-20 | Payer: MEDICARE

## 2018-07-19 ENCOUNTER — Encounter: Admit: 2018-07-19 | Discharge: 2018-07-20 | Payer: MEDICARE | Attending: Psychiatry | Primary: Psychiatry

## 2018-07-19 DIAGNOSIS — F3170 Bipolar disorder, currently in remission, most recent episode unspecified: Secondary | ICD-10-CM

## 2018-07-19 DIAGNOSIS — F39 Unspecified mood [affective] disorder: Principal | ICD-10-CM

## 2018-07-19 DIAGNOSIS — R079 Chest pain, unspecified: Principal | ICD-10-CM

## 2018-07-19 DIAGNOSIS — I10 Essential (primary) hypertension: Secondary | ICD-10-CM

## 2018-07-19 MED ORDER — ARIPIPRAZOLE 10 MG TABLET
ORAL_TABLET | Freq: Every day | ORAL | 0 refills | 0.00000 days | Status: CP
Start: 2018-07-19 — End: 2018-09-17

## 2018-07-26 ENCOUNTER — Institutional Professional Consult (permissible substitution): Admit: 2018-07-26 | Discharge: 2018-07-27 | Payer: MEDICARE | Attending: Registered" | Primary: Registered"

## 2018-07-26 DIAGNOSIS — Z6841 Body Mass Index (BMI) 40.0 and over, adult: Principal | ICD-10-CM

## 2018-07-27 ENCOUNTER — Ambulatory Visit: Admit: 2018-07-27 | Discharge: 2018-07-28 | Payer: MEDICARE

## 2018-07-27 DIAGNOSIS — K219 Gastro-esophageal reflux disease without esophagitis: Secondary | ICD-10-CM

## 2018-07-27 DIAGNOSIS — Z6841 Body Mass Index (BMI) 40.0 and over, adult: Secondary | ICD-10-CM

## 2018-07-27 DIAGNOSIS — Z0389 Encounter for observation for other suspected diseases and conditions ruled out: Principal | ICD-10-CM

## 2018-07-27 DIAGNOSIS — Z7189 Other specified counseling: Secondary | ICD-10-CM

## 2018-07-27 DIAGNOSIS — E119 Type 2 diabetes mellitus without complications: Secondary | ICD-10-CM

## 2018-07-29 ENCOUNTER — Encounter: Admit: 2018-07-29 | Discharge: 2018-07-30 | Payer: MEDICARE

## 2018-07-29 DIAGNOSIS — Z20828 Contact with and (suspected) exposure to other viral communicable diseases: Principal | ICD-10-CM

## 2018-08-01 MED ORDER — ARIPIPRAZOLE ER 400 MG INTRAMUSCULAR SUSPENSION,EXTENDED RELEASE
INTRAMUSCULAR | 3 refills | 0 days
Start: 2018-08-01 — End: 2018-10-04

## 2018-08-01 MED ORDER — ABILIFY MAINTENA 400 MG INTRAMUSCULAR SUSPENSION,EXTENDED RELEASE
INTRAMUSCULAR | 3 refills | 0.00000 days | Status: CP
Start: 2018-08-01 — End: 2018-10-04
  Filled 2018-08-05: qty 1, 30d supply, fill #0

## 2018-08-05 MED FILL — ABILIFY MAINTENA 400 MG INTRAMUSCULAR SUSPENSION,EXTENDED RELEASE: 30 days supply | Qty: 1 | Fill #0 | Status: AC

## 2018-08-16 ENCOUNTER — Encounter: Admit: 2018-08-16 | Discharge: 2018-08-17 | Payer: MEDICARE | Attending: Psychiatry | Primary: Psychiatry

## 2018-08-16 MED ORDER — ESZOPICLONE 1 MG TABLET
ORAL_TABLET | Freq: Every evening | ORAL | 0 refills | 0.00000 days | Status: CP
Start: 2018-08-16 — End: 2018-08-30

## 2018-08-20 ENCOUNTER — Non-Acute Institutional Stay: Admit: 2018-08-20 | Discharge: 2018-08-21 | Payer: MEDICARE

## 2018-08-20 DIAGNOSIS — Z6841 Body Mass Index (BMI) 40.0 and over, adult: Principal | ICD-10-CM

## 2018-08-20 DIAGNOSIS — Z7189 Other specified counseling: Secondary | ICD-10-CM

## 2018-08-20 DIAGNOSIS — Z0389 Encounter for observation for other suspected diseases and conditions ruled out: Secondary | ICD-10-CM

## 2018-08-23 ENCOUNTER — Encounter: Admit: 2018-08-23 | Discharge: 2018-08-24 | Payer: MEDICARE | Attending: Family | Primary: Family

## 2018-08-23 DIAGNOSIS — Z7189 Other specified counseling: Secondary | ICD-10-CM

## 2018-08-23 DIAGNOSIS — E119 Type 2 diabetes mellitus without complications: Secondary | ICD-10-CM

## 2018-08-23 DIAGNOSIS — Z6841 Body Mass Index (BMI) 40.0 and over, adult: Principal | ICD-10-CM

## 2018-08-23 DIAGNOSIS — F3170 Bipolar disorder, currently in remission, most recent episode unspecified: Secondary | ICD-10-CM

## 2018-09-10 ENCOUNTER — Encounter: Admit: 2018-09-10 | Discharge: 2018-09-11 | Payer: MEDICARE

## 2018-09-10 DIAGNOSIS — G43001 Migraine without aura, not intractable, with status migrainosus: Principal | ICD-10-CM

## 2018-09-10 MED ORDER — SUMATRIPTAN 6 MG/0.5 ML SUBCUTANEOUS PEN INJECTOR
INJECTION | 5 refills | 0 days | Status: CP
Start: 2018-09-10 — End: ?

## 2018-09-21 ENCOUNTER — Encounter (HOSPITAL_COMMUNITY): Payer: Self-pay | Admitting: Emergency Medicine

## 2018-09-21 ENCOUNTER — Other Ambulatory Visit: Payer: Self-pay

## 2018-09-21 ENCOUNTER — Emergency Department (HOSPITAL_COMMUNITY): Payer: 59

## 2018-09-21 ENCOUNTER — Emergency Department (HOSPITAL_COMMUNITY)
Admission: EM | Admit: 2018-09-21 | Discharge: 2018-09-21 | Disposition: A | Discharge status: Home / Self Care | Payer: 59 | Attending: Emergency Medicine | Admitting: Emergency Medicine

## 2018-09-21 DIAGNOSIS — R51 Headache: Secondary | ICD-10-CM | POA: Insufficient documentation

## 2018-09-21 DIAGNOSIS — Z5321 Procedure and treatment not carried out due to patient leaving prior to being seen by health care provider: Secondary | ICD-10-CM | POA: Insufficient documentation

## 2018-09-21 HISTORY — DX: Type 2 diabetes mellitus without complications: E11.9

## 2018-09-21 HISTORY — DX: Albinism, unspecified: E70.30

## 2018-09-21 HISTORY — DX: Unspecified visual loss: H54.7

## 2018-09-21 NOTE — ED Notes (Signed)
Pt left per Vanita Ingles, NT
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
## 2018-09-23 ENCOUNTER — Institutional Professional Consult (permissible substitution): Admit: 2018-09-23 | Discharge: 2018-09-24 | Payer: MEDICARE | Attending: Family Medicine | Primary: Family Medicine

## 2018-09-23 DIAGNOSIS — R1013 Epigastric pain: Principal | ICD-10-CM

## 2018-09-25 MED ORDER — VICTOZA 3-PAK 0.6 MG/0.1 ML (18 MG/3 ML) SUBCUTANEOUS PEN INJECTOR
0 refills | 0 days | Status: CP
Start: 2018-09-25 — End: 2018-10-14

## 2018-10-04 ENCOUNTER — Encounter: Admit: 2018-10-04 | Discharge: 2018-10-05 | Payer: MEDICARE

## 2018-10-04 MED ORDER — PRAZOSIN 1 MG CAPSULE
ORAL_CAPSULE | Freq: Every evening | ORAL | 2 refills | 30 days | Status: CP
Start: 2018-10-04 — End: 2019-10-04

## 2018-10-04 MED ORDER — ARIPIPRAZOLE 1 MG/ML ORAL SOLUTION
Freq: Every day | ORAL | 2 refills | 30 days | Status: CP
Start: 2018-10-04 — End: 2018-11-03
  Filled 2018-11-02: qty 300, 30d supply, fill #0

## 2018-10-04 MED ORDER — FLUOXETINE 20 MG/5 ML (4 MG/ML) ORAL SOLUTION
Freq: Every day | ORAL | 2 refills | 30.00000 days | Status: CP
Start: 2018-10-04 — End: 2019-10-04
  Filled 2018-11-28: qty 300, 30d supply, fill #0

## 2018-10-07 ENCOUNTER — Telehealth: Admit: 2018-10-07 | Discharge: 2018-10-08 | Payer: MEDICARE

## 2018-10-07 DIAGNOSIS — Z6841 Body Mass Index (BMI) 40.0 and over, adult: Secondary | ICD-10-CM

## 2018-10-08 ENCOUNTER — Encounter: Admit: 2018-10-08 | Discharge: 2018-10-09 | Payer: MEDICARE

## 2018-10-08 DIAGNOSIS — H5440 Blindness, one eye, unspecified eye: Secondary | ICD-10-CM

## 2018-10-08 DIAGNOSIS — R55 Syncope and collapse: Secondary | ICD-10-CM

## 2018-10-08 DIAGNOSIS — Z6841 Body Mass Index (BMI) 40.0 and over, adult: Secondary | ICD-10-CM

## 2018-10-08 DIAGNOSIS — K219 Gastro-esophageal reflux disease without esophagitis: Secondary | ICD-10-CM

## 2018-10-08 DIAGNOSIS — D573 Sickle-cell trait: Secondary | ICD-10-CM

## 2018-10-08 DIAGNOSIS — F329 Major depressive disorder, single episode, unspecified: Secondary | ICD-10-CM

## 2018-10-08 DIAGNOSIS — Z01818 Encounter for other preprocedural examination: Principal | ICD-10-CM

## 2018-10-08 DIAGNOSIS — F4310 Post-traumatic stress disorder, unspecified: Secondary | ICD-10-CM

## 2018-10-08 DIAGNOSIS — E0800 Diabetes mellitus due to underlying condition with hyperosmolarity without nonketotic hyperglycemic-hyperosmolar coma (NKHHC): Secondary | ICD-10-CM

## 2018-10-08 DIAGNOSIS — G629 Polyneuropathy, unspecified: Secondary | ICD-10-CM

## 2018-10-14 MED ORDER — VICTOZA 3-PAK 0.6 MG/0.1 ML (18 MG/3 ML) SUBCUTANEOUS PEN INJECTOR
1 refills | 0 days | Status: CP
Start: 2018-10-14 — End: ?

## 2018-10-23 ENCOUNTER — Encounter: Admit: 2018-10-23 | Discharge: 2018-10-24 | Payer: MEDICARE | Attending: Family Medicine | Primary: Family Medicine

## 2018-10-23 DIAGNOSIS — K5900 Constipation, unspecified: Principal | ICD-10-CM

## 2018-10-23 MED ORDER — BISACODYL 10 MG RECTAL SUPPOSITORY
Freq: Every day | RECTAL | 0 refills | 1.00000 days | Status: CP
Start: 2018-10-23 — End: ?

## 2018-10-23 MED ORDER — POLYETHYLENE GLYCOL 3350 17 GRAM/DOSE ORAL POWDER
Freq: Every day | ORAL | 0 refills | 15.00000 days | Status: CP
Start: 2018-10-23 — End: ?

## 2018-10-29 DIAGNOSIS — Z79899 Other long term (current) drug therapy: Secondary | ICD-10-CM

## 2018-10-29 DIAGNOSIS — H9042 Sensorineural hearing loss, unilateral, left ear, with unrestricted hearing on the contralateral side: Secondary | ICD-10-CM

## 2018-10-29 DIAGNOSIS — Z87891 Personal history of nicotine dependence: Secondary | ICD-10-CM

## 2018-10-29 DIAGNOSIS — Z20828 Contact with and (suspected) exposure to other viral communicable diseases: Secondary | ICD-10-CM

## 2018-10-29 DIAGNOSIS — F329 Major depressive disorder, single episode, unspecified: Secondary | ICD-10-CM

## 2018-10-29 DIAGNOSIS — D573 Sickle-cell trait: Secondary | ICD-10-CM

## 2018-10-29 DIAGNOSIS — E1142 Type 2 diabetes mellitus with diabetic polyneuropathy: Secondary | ICD-10-CM

## 2018-10-29 DIAGNOSIS — K219 Gastro-esophageal reflux disease without esophagitis: Secondary | ICD-10-CM

## 2018-10-29 DIAGNOSIS — Z1159 Encounter for screening for other viral diseases: Secondary | ICD-10-CM

## 2018-10-29 DIAGNOSIS — F419 Anxiety disorder, unspecified: Secondary | ICD-10-CM

## 2018-10-29 DIAGNOSIS — Z7984 Long term (current) use of oral hypoglycemic drugs: Secondary | ICD-10-CM

## 2018-10-29 DIAGNOSIS — Z6841 Body Mass Index (BMI) 40.0 and over, adult: Secondary | ICD-10-CM

## 2018-10-30 DIAGNOSIS — D573 Sickle-cell trait: Secondary | ICD-10-CM

## 2018-10-30 DIAGNOSIS — F329 Major depressive disorder, single episode, unspecified: Secondary | ICD-10-CM

## 2018-10-30 DIAGNOSIS — Z7984 Long term (current) use of oral hypoglycemic drugs: Secondary | ICD-10-CM

## 2018-10-30 DIAGNOSIS — Z79899 Other long term (current) drug therapy: Secondary | ICD-10-CM

## 2018-10-30 DIAGNOSIS — Z87891 Personal history of nicotine dependence: Secondary | ICD-10-CM

## 2018-10-30 DIAGNOSIS — H9042 Sensorineural hearing loss, unilateral, left ear, with unrestricted hearing on the contralateral side: Secondary | ICD-10-CM

## 2018-10-30 DIAGNOSIS — E1142 Type 2 diabetes mellitus with diabetic polyneuropathy: Secondary | ICD-10-CM

## 2018-10-30 DIAGNOSIS — Z6841 Body Mass Index (BMI) 40.0 and over, adult: Secondary | ICD-10-CM

## 2018-10-30 DIAGNOSIS — F419 Anxiety disorder, unspecified: Secondary | ICD-10-CM

## 2018-10-30 DIAGNOSIS — K219 Gastro-esophageal reflux disease without esophagitis: Secondary | ICD-10-CM

## 2018-10-30 DIAGNOSIS — Z20828 Contact with and (suspected) exposure to other viral communicable diseases: Secondary | ICD-10-CM

## 2018-10-31 ENCOUNTER — Ambulatory Visit: Admit: 2018-10-31 | Discharge: 2018-11-02 | Disposition: A | Discharge status: Home / Self Care | Payer: MEDICARE

## 2018-10-31 ENCOUNTER — Encounter
Admit: 2018-10-31 | Discharge: 2018-11-02 | Disposition: A | Discharge status: Home / Self Care | Payer: MEDICARE | Attending: Student in an Organized Health Care Education/Training Program

## 2018-10-31 ENCOUNTER — Ambulatory Visit
Admit: 2018-10-31 | Discharge: 2018-11-02 | Disposition: A | Discharge status: Home / Self Care | Payer: MEDICARE | Attending: Family

## 2018-10-31 DIAGNOSIS — F419 Anxiety disorder, unspecified: Secondary | ICD-10-CM

## 2018-10-31 DIAGNOSIS — H9042 Sensorineural hearing loss, unilateral, left ear, with unrestricted hearing on the contralateral side: Secondary | ICD-10-CM

## 2018-10-31 DIAGNOSIS — Z7984 Long term (current) use of oral hypoglycemic drugs: Secondary | ICD-10-CM

## 2018-10-31 DIAGNOSIS — Z6841 Body Mass Index (BMI) 40.0 and over, adult: Secondary | ICD-10-CM

## 2018-10-31 DIAGNOSIS — F329 Major depressive disorder, single episode, unspecified: Secondary | ICD-10-CM

## 2018-10-31 DIAGNOSIS — E1142 Type 2 diabetes mellitus with diabetic polyneuropathy: Secondary | ICD-10-CM

## 2018-10-31 DIAGNOSIS — Z20828 Contact with and (suspected) exposure to other viral communicable diseases: Secondary | ICD-10-CM

## 2018-10-31 DIAGNOSIS — Z79899 Other long term (current) drug therapy: Secondary | ICD-10-CM

## 2018-10-31 DIAGNOSIS — K219 Gastro-esophageal reflux disease without esophagitis: Secondary | ICD-10-CM

## 2018-10-31 DIAGNOSIS — D573 Sickle-cell trait: Secondary | ICD-10-CM

## 2018-10-31 DIAGNOSIS — Z87891 Personal history of nicotine dependence: Secondary | ICD-10-CM

## 2018-10-31 DIAGNOSIS — Z9884 Bariatric surgery status: Secondary | ICD-10-CM | POA: Insufficient documentation

## 2018-11-01 DIAGNOSIS — Z6841 Body Mass Index (BMI) 40.0 and over, adult: Secondary | ICD-10-CM

## 2018-11-01 DIAGNOSIS — Z7984 Long term (current) use of oral hypoglycemic drugs: Secondary | ICD-10-CM

## 2018-11-01 DIAGNOSIS — E1142 Type 2 diabetes mellitus with diabetic polyneuropathy: Secondary | ICD-10-CM

## 2018-11-01 DIAGNOSIS — F329 Major depressive disorder, single episode, unspecified: Secondary | ICD-10-CM

## 2018-11-01 DIAGNOSIS — Z87891 Personal history of nicotine dependence: Secondary | ICD-10-CM

## 2018-11-01 DIAGNOSIS — F419 Anxiety disorder, unspecified: Secondary | ICD-10-CM

## 2018-11-01 DIAGNOSIS — Z79899 Other long term (current) drug therapy: Secondary | ICD-10-CM

## 2018-11-01 DIAGNOSIS — D573 Sickle-cell trait: Secondary | ICD-10-CM

## 2018-11-01 DIAGNOSIS — Z20828 Contact with and (suspected) exposure to other viral communicable diseases: Secondary | ICD-10-CM

## 2018-11-01 DIAGNOSIS — K219 Gastro-esophageal reflux disease without esophagitis: Secondary | ICD-10-CM

## 2018-11-01 DIAGNOSIS — H9042 Sensorineural hearing loss, unilateral, left ear, with unrestricted hearing on the contralateral side: Secondary | ICD-10-CM

## 2018-11-02 MED ORDER — HYDROMORPHONE 2 MG TABLET
ORAL_TABLET | ORAL | 0 refills | 5.00000 days | Status: CP | PRN
Start: 2018-11-02 — End: 2018-11-05
  Filled 2018-11-02: qty 30, 5d supply, fill #0

## 2018-11-02 MED ORDER — PROMETHAZINE 12.5 MG RECTAL SUPPOSITORY
Freq: Four times a day (QID) | RECTAL | 0 refills | 3.00000 days | Status: CP | PRN
Start: 2018-11-02 — End: 2018-12-02
  Filled 2018-11-02: qty 12, 3d supply, fill #0

## 2018-11-02 MED ORDER — DOCUSATE SODIUM 50 MG/5 ML ORAL LIQUID
Freq: Two times a day (BID) | ORAL | 0 refills | 24.00000 days | Status: CP
Start: 2018-11-02 — End: 2018-12-02
  Filled 2018-11-02: qty 473, 24d supply, fill #0

## 2018-11-02 MED ORDER — GABAPENTIN 300 MG CAPSULE
ORAL_CAPSULE | Freq: Three times a day (TID) | ORAL | 0 refills | 30.00000 days | Status: CP
Start: 2018-11-02 — End: 2018-12-02
  Filled 2018-11-02: qty 180, 30d supply, fill #0

## 2018-11-02 MED ORDER — PRAZOSIN 1 MG CAPSULE
ORAL_CAPSULE | Freq: Every evening | ORAL | 0 refills | 30.00000 days | Status: CP
Start: 2018-11-02 — End: 2018-11-22
  Filled 2018-11-02: qty 30, 30d supply, fill #0

## 2018-11-02 MED ORDER — ONDANSETRON 4 MG DISINTEGRATING TABLET
ORAL_TABLET | Freq: Four times a day (QID) | ORAL | 0 refills | 7.00000 days | Status: CP | PRN
Start: 2018-11-02 — End: 2018-12-02
  Filled 2018-11-02: qty 28, 7d supply, fill #0

## 2018-11-02 MED ORDER — OMEPRAZOLE 40 MG CAPSULE,DELAYED RELEASE
ORAL_CAPSULE | Freq: Every day | ORAL | 6 refills | 30.00000 days | Status: CP
Start: 2018-11-02 — End: 2019-05-31
  Filled 2018-11-02: qty 30, 30d supply, fill #0

## 2018-11-02 MED ORDER — ACETAMINOPHEN 160 MG/5 ML ORAL SUSPENSION
Freq: Three times a day (TID) | ORAL | 0 refills | 7.00000 days | Status: CP
Start: 2018-11-02 — End: 2018-12-02
  Filled 2018-11-02: qty 118, 1d supply, fill #0

## 2018-11-02 MED FILL — CHILDREN'S ACETAMINOPHEN 160 MG/5 ML ORAL SUSPENSION: 1 days supply | Qty: 118 | Fill #0 | Status: AC

## 2018-11-02 MED FILL — PRAZOSIN 1 MG CAPSULE: 30 days supply | Qty: 30 | Fill #0 | Status: AC

## 2018-11-02 MED FILL — PROMETHEGAN 12.5 MG RECTAL SUPPOSITORY: 3 days supply | Qty: 12 | Fill #0 | Status: AC

## 2018-11-02 MED FILL — ONDANSETRON 4 MG DISINTEGRATING TABLET: 7 days supply | Qty: 28 | Fill #0 | Status: AC

## 2018-11-02 MED FILL — SILACE 50 MG/5 ML ORAL LIQUID: 24 days supply | Qty: 473 | Fill #0 | Status: AC

## 2018-11-02 MED FILL — HYDROMORPHONE 2 MG TABLET: 5 days supply | Qty: 30 | Fill #0 | Status: AC

## 2018-11-02 MED FILL — ARIPIPRAZOLE 1 MG/ML ORAL SOLUTION: 30 days supply | Qty: 300 | Fill #0 | Status: AC

## 2018-11-02 MED FILL — OMEPRAZOLE 40 MG CAPSULE,DELAYED RELEASE: 30 days supply | Qty: 30 | Fill #0 | Status: AC

## 2018-11-02 MED FILL — GABAPENTIN 300 MG CAPSULE: 30 days supply | Qty: 180 | Fill #0 | Status: AC

## 2018-11-05 ENCOUNTER — Encounter: Admit: 2018-11-05 | Discharge: 2018-11-06 | Payer: MEDICARE

## 2018-11-05 DIAGNOSIS — Z9884 Bariatric surgery status: Secondary | ICD-10-CM

## 2018-11-05 MED ORDER — HYDROMORPHONE 2 MG TABLET
ORAL_TABLET | ORAL | 0 refills | 3 days | Status: CP | PRN
Start: 2018-11-05 — End: 2018-12-02

## 2018-11-15 ENCOUNTER — Institutional Professional Consult (permissible substitution): Admit: 2018-11-15 | Discharge: 2018-11-16 | Payer: MEDICARE

## 2018-11-15 DIAGNOSIS — E119 Type 2 diabetes mellitus without complications: Secondary | ICD-10-CM

## 2018-11-15 DIAGNOSIS — E0800 Diabetes mellitus due to underlying condition with hyperosmolarity without nonketotic hyperglycemic-hyperosmolar coma (NKHHC): Secondary | ICD-10-CM

## 2018-11-15 MED ORDER — BLOOD-GLUCOSE METER KIT WRAPPER
11 refills | 0 days | Status: CP
Start: 2018-11-15 — End: 2019-11-15

## 2018-11-15 MED ORDER — METFORMIN ER 1,000 MG 24 HR TABLET,EXTENDED RELEASE
ORAL_TABLET | Freq: Two times a day (BID) | ORAL | 3 refills | 90.00000 days | Status: CP
Start: 2018-11-15 — End: 2018-11-19

## 2018-11-18 ENCOUNTER — Encounter: Admit: 2018-11-18 | Discharge: 2018-11-19 | Payer: MEDICARE

## 2018-11-18 DIAGNOSIS — Z6841 Body Mass Index (BMI) 40.0 and over, adult: Secondary | ICD-10-CM

## 2018-11-18 DIAGNOSIS — Z9884 Bariatric surgery status: Secondary | ICD-10-CM

## 2018-11-18 MED ORDER — URSODIOL 300 MG CAPSULE
ORAL_CAPSULE | Freq: Two times a day (BID) | ORAL | 5 refills | 30 days | Status: CP
Start: 2018-11-18 — End: 2019-11-18
  Filled 2018-11-28: qty 60, 30d supply, fill #0

## 2018-11-19 MED ORDER — METFORMIN 1,000 MG TABLET
ORAL_TABLET | Freq: Two times a day (BID) | ORAL | 11 refills | 30 days | Status: CP
Start: 2018-11-19 — End: 2019-11-19

## 2018-11-21 ENCOUNTER — Encounter: Admit: 2018-11-21 | Discharge: 2018-11-22 | Payer: MEDICARE

## 2018-11-21 DIAGNOSIS — Z113 Encounter for screening for infections with a predominantly sexual mode of transmission: Secondary | ICD-10-CM

## 2018-11-21 DIAGNOSIS — Z1159 Encounter for screening for other viral diseases: Secondary | ICD-10-CM

## 2018-11-21 DIAGNOSIS — E119 Type 2 diabetes mellitus without complications: Secondary | ICD-10-CM

## 2018-11-21 DIAGNOSIS — M546 Pain in thoracic spine: Secondary | ICD-10-CM

## 2018-11-21 DIAGNOSIS — Z114 Encounter for screening for human immunodeficiency virus [HIV]: Secondary | ICD-10-CM

## 2018-11-21 MED ORDER — TRAMADOL 50 MG TABLET
ORAL_TABLET | Freq: Four times a day (QID) | ORAL | 0 refills | 5.00000 days | Status: CP | PRN
Start: 2018-11-21 — End: 2018-12-03
  Filled 2018-11-28: qty 20, 5d supply, fill #0

## 2018-11-22 ENCOUNTER — Encounter: Admit: 2018-11-22 | Discharge: 2018-11-23 | Payer: MEDICARE

## 2018-11-22 DIAGNOSIS — F54 Psychological and behavioral factors associated with disorders or diseases classified elsewhere: Secondary | ICD-10-CM

## 2018-11-22 DIAGNOSIS — F39 Unspecified mood [affective] disorder: Secondary | ICD-10-CM

## 2018-11-22 DIAGNOSIS — F4310 Post-traumatic stress disorder, unspecified: Secondary | ICD-10-CM

## 2018-11-22 DIAGNOSIS — F3170 Bipolar disorder, currently in remission, most recent episode unspecified: Secondary | ICD-10-CM

## 2018-11-22 DIAGNOSIS — Z6841 Body Mass Index (BMI) 40.0 and over, adult: Secondary | ICD-10-CM

## 2018-11-22 DIAGNOSIS — F330 Major depressive disorder, recurrent, mild: Secondary | ICD-10-CM

## 2018-11-22 MED ORDER — TRAZODONE 50 MG TABLET
ORAL_TABLET | Freq: Every evening | ORAL | 3 refills | 30 days | Status: CP | PRN
Start: 2018-11-22 — End: ?
  Filled 2018-11-28: qty 60, 30d supply, fill #0

## 2018-11-26 ENCOUNTER — Encounter: Admit: 2018-11-26 | Discharge: 2018-11-26 | Disposition: A | Discharge status: Home / Self Care | Payer: MEDICARE

## 2018-11-26 DIAGNOSIS — R1011 Right upper quadrant pain: Secondary | ICD-10-CM

## 2018-11-26 DIAGNOSIS — R109 Unspecified abdominal pain: Secondary | ICD-10-CM

## 2018-11-26 DIAGNOSIS — Z87891 Personal history of nicotine dependence: Secondary | ICD-10-CM

## 2018-11-26 DIAGNOSIS — F419 Anxiety disorder, unspecified: Secondary | ICD-10-CM

## 2018-11-26 DIAGNOSIS — Z6841 Body Mass Index (BMI) 40.0 and over, adult: Secondary | ICD-10-CM

## 2018-11-26 DIAGNOSIS — R143 Flatulence: Secondary | ICD-10-CM

## 2018-11-26 DIAGNOSIS — R112 Nausea with vomiting, unspecified: Secondary | ICD-10-CM

## 2018-11-26 DIAGNOSIS — Z9884 Bariatric surgery status: Secondary | ICD-10-CM

## 2018-11-26 DIAGNOSIS — E119 Type 2 diabetes mellitus without complications: Secondary | ICD-10-CM

## 2018-11-26 DIAGNOSIS — R1013 Epigastric pain: Secondary | ICD-10-CM

## 2018-11-26 DIAGNOSIS — F329 Major depressive disorder, single episode, unspecified: Secondary | ICD-10-CM

## 2018-11-26 DIAGNOSIS — R1012 Left upper quadrant pain: Secondary | ICD-10-CM

## 2018-11-28 DIAGNOSIS — R112 Nausea with vomiting, unspecified: Secondary | ICD-10-CM

## 2018-11-28 DIAGNOSIS — K769 Liver disease, unspecified: Secondary | ICD-10-CM

## 2018-11-28 DIAGNOSIS — R638 Other symptoms and signs concerning food and fluid intake: Secondary | ICD-10-CM

## 2018-11-28 DIAGNOSIS — Z9884 Bariatric surgery status: Secondary | ICD-10-CM

## 2018-11-28 MED FILL — TRAMADOL 50 MG TABLET: 5 days supply | Qty: 20 | Fill #0 | Status: AC

## 2018-11-28 MED FILL — TRAZODONE 50 MG TABLET: 30 days supply | Qty: 60 | Fill #0 | Status: AC

## 2018-11-28 MED FILL — URSODIOL 300 MG CAPSULE: 30 days supply | Qty: 60 | Fill #0 | Status: AC

## 2018-11-28 MED FILL — FLUOXETINE 20 MG/5 ML (4 MG/ML) ORAL SOLUTION: 30 days supply | Qty: 300 | Fill #0 | Status: AC

## 2018-11-30 ENCOUNTER — Encounter
Admit: 2018-11-30 | Discharge: 2018-12-01 | Payer: MEDICARE | Attending: Critical Care Medicine | Primary: Critical Care Medicine

## 2018-11-30 DIAGNOSIS — Z20828 Contact with and (suspected) exposure to other viral communicable diseases: Secondary | ICD-10-CM

## 2018-11-30 DIAGNOSIS — Z01812 Encounter for preprocedural laboratory examination: Secondary | ICD-10-CM

## 2018-12-02 ENCOUNTER — Encounter: Admit: 2018-12-02 | Discharge: 2018-12-02 | Payer: MEDICARE | Attending: Anesthesiology | Primary: Anesthesiology

## 2018-12-02 ENCOUNTER — Encounter: Admit: 2018-12-02 | Discharge: 2018-12-02 | Payer: MEDICARE

## 2018-12-06 ENCOUNTER — Encounter: Admit: 2018-12-06 | Discharge: 2018-12-07 | Payer: MEDICARE

## 2018-12-06 DIAGNOSIS — F3170 Bipolar disorder, currently in remission, most recent episode unspecified: Secondary | ICD-10-CM

## 2018-12-06 NOTE — Unmapped (Signed)
[facility] Specialty Medication Referral: No PA required  ??  Medication (Brand/Generic): Abilify Maintena 400 mg Serr Injection   ??  Initial Benefits Investigation Claim completed with resulted information below:  No PA required  Patient ABLE to fill at [facility]  Insurance Company:  [employer]  Anticipated Copay: $0  ??  As Co-pay is under $25 defined limit, per policy there will be no further investigation of need for financial assistance at this time unless patient requests. This referral has been communicated to the provider and handed off to the [facility] team for further processing and filling of prescribed medication.   ______________________________________________________________________  Please utilize this referral for viewing purposes as it will serve as the central location for all relevant documentation and updates.

## 2018-12-06 NOTE — Unmapped (Signed)
Patient came in today for injection only visit.  Vitals are within normal limit.  See MAR for injection details.

## 2018-12-07 ENCOUNTER — Encounter: Admit: 2018-12-07 | Discharge: 2018-12-07 | Payer: MEDICARE

## 2018-12-11 NOTE — Unmapped (Signed)
Recent:   What is the date of your last related visit?  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  Discussed with patient - requested transfer to [facility] GI office.     Reason for Disposition  ??? Constant abdominal pain lasting > 2 hours    Answer Assessment - Initial Assessment Questions  1. STOOL PATTERN OR FREQUENCY: How often do you pass bowel movements (BMs)?  (Normal range: tid to q 3 days)  When was the last BM passed?      Has been more than a week since last BM. Had gastric bypass 9.3.20  2. STRAINING: Do you have to strain to have a BM?      Denies  3. RECTAL PAIN: Does your rectum hurt when the stool comes out? If so, ask: Do you have hemorrhoids? How bad is the pain?  (Scale 1-10; or mild, moderate, severe)  Denies rectal pain, possible hemorrhoids  4. STOOL COMPOSITION: Are the stools hard?     Not hard  5. BLOOD ON STOOLS: Has there been any blood on the toilet tissue or on the surface of the BM? If so, ask: When was the last time?   No blood  6. CHRONIC CONSTIPATION: Is this a new problem for you?  If no, ask: How long have you had this problem? (days, weeks, months)     Prior to surgery did not have issues with constipation. No fever  7. CHANGES IN DIET: Have there been any recent changes in your diet?     Soft diet - small, frequent meals. Tolerating 2 - 16 oz bottles of water  8. MEDICATIONS: Have you been taking any new medications?  Denies  9. LAXATIVES: Have you been using any laxatives or enemas?  If yes, ask What, how often, and when was the last time? Tried OTC - Miralax, oral laxative, Senna, enema, suppository  10. CAUSE: What do you think is causing the constipation?     No sure  11. OTHER SYMPTOMS: Do you have any other symptoms? (e.g., abdominal pain, fever, vomiting)  Abdominal pain in middle of stomach, described as achy pain 3/10 constant for the last 3-4 weeks - had 2 episodes of vomiting 10.13.20 - no blood  12. PREGNANCY: Is there any chance you are pregnant? When was your last menstrual period?No chance    Protocols used: CONSTIPATION-ADULT-OH

## 2018-12-11 NOTE — Unmapped (Signed)
[facility]   Patient Onboarding/Medication Counseling    The patient declined counseling on medication administration, missed dose instructions, goals of therapy, side effects and monitoring parameters, warnings and precautions, drug/food interactions and storage, handling precautions, and disposal because they have taken the medication previously. The information in the declined sections below are for informational purposes only and was not discussed with patient.     Ashley Dunlap is a 28 y.o. female with Bipolar affective disorder, currently in remission who I am counseling today on continuation of therapy.  I am speaking to the patient.    Verified patient's date of birth / HIPAA.    Specialty medication(s) to be sent: General Specialty: [name]      Non-specialty medications/supplies to be sent: none      Medications not needed at this time: none         Abilify / Aristada (aripiprazole) injection    Medication & Administration     Dosage: Inject 2 mL (400 mg total) into the muscle every twenty-eight (28) days for 24 doses.    Administration: Administered IM in the clinic    Adherence/Missed dose instructions: Call your provider.    Goals of Therapy     Used in the treatment of schizophrenia in adults    Side Effects & Monitoring Parameters   Common side effects:   ? Akathisia: restlessness, inability to sit still (legs most commonly affected)  ? Weight gain  ? Irritation at injection site    The following side effects should be reported to the provider:  ? Allergic reaction  ? High blood sugars ( confusion, increased sleepiness, increased thirst, increased hunger, increased urination; fruity breath)  ? Trouble controlling body movements  ? Strong urges that are hard to control (eating, gambling, sex, or spending money)  ? Extreme dizziness or passing out  ? Fast heartbeat  ? Seizures ? NMS (neuroleptic malignant syndrome): fever muscle cramps, stiffness, dizziness, very bad headache, confusion, change in thinking, abnormal heartbeat, increased sweating  ? Tardive dyskinesia - severe muscle problems (trouble controlling body movements, problems with tongue, face, mouth, or jaw)      Contraindications, Warnings, & Precautions     ? [US Boxed Warning]: Elderly patients with dementia-related psychosis treated with antipsychotics are at an   increased risk of death compared to placeb        ? May cause change in lipid profile albeit minimal to low  ? Associated with esophageal dysmotility and aspiration  ? May cause extrapyramidal symptoms like pseudoparkinsonism, akathisia, tardive dyskinesia  ? May increase risk of falls  ? May cause hyperglycemia  ? Issues with temperature regulation    Drug/Food Interactions     ? Medication list reviewed in Epic. The patient was instructed to inform the care team before taking any new medications or supplements. Aripiprazole may enhance the adverse/toxic effect of other CNS depressants like gapapentin, temazepam, and trazodone (and vice versua). Aripiprazole may effect metformin levels.  Possible dose reduction necessary if used with fluoxetine (Pt on low dose 10 mg fluoxetine).  Patient has been on medications in past without issue.      Storage, Handling Precautions, & Disposal   ? Store this medication at room temperature.  Keep away from children and pets    Current Medications (including OTC/herbals), Comorbidities and Allergies     Current Outpatient Medications   Medication Sig Dispense Refill   ??? [START ON 01/03/2019] ARIPiprazole (ABILIFY MAINTENA) 400 mg SERR injection  Inject 2 mL (400 mg total) into the muscle every twenty-eight (28) days for 24 doses. 24 each 0   ??? ARIPiprazole (ABILIFY) 1 mg/mL mL Take 10 mL (10 mg total) by mouth daily for 30 doses. 300 mL 2 ??? blood-glucose meter kit Disp. blood glucose meter kit preferred by patient's insurance. Check blood sugars as directed by provider. Dx: Diabetes, E11.9 1 each 11   ??? ergocalciferol (DRISDOL) 1,250 mcg (50,000 unit) capsule Take 1 capsule (50,000 Units total) by mouth once a week. 4 capsule 1   ??? FLUoxetine (PROZAC) 20 mg/5 mL (4 mg/mL) solution Take 10 mL (40 mg total) by mouth daily. 300 mL 2   ??? gabapentin (NEURONTIN) 300 MG capsule Take 2 capsules (600 mg total) by mouth Three (3) times a day. Open capsule, pour contents on top of food or in liquid 180 capsule 0   ??? lancets 31 gauge Misc 1 each by Miscellaneous route Four (4) times a day. DX code E11.9 100 each 3   ??? medroxyPROGESTERone (PROVERA) 10 MG tablet 2 tablets for 3 days, then 1 tablet. 30 tablet 0   ??? metFORMIN (GLUCOPHAGE) 1000 MG tablet Take 1 tablet (1,000 mg total) by mouth 2 (two) times a day with meals. For Diabetes 60 tablet 11   ??? omeprazole (PRILOSEC) 40 MG capsule Take 1 capsule (40 mg total) by mouth daily. Open capsule and sprinkle on food or mix with water 30 capsule 6   ??? polyethylene glycol (GLYCOLAX) 17 gram/dose powder Take 17 g by mouth daily. 255 g 0   ??? senna-docusate (PERICOLACE) 8.6-50 mg Take 1 tablet by mouth daily. 30 tablet 2   ??? SUMAtriptan succinate 6 mg/0.5 mL PnIj Use 1 injection to relieve migraine HA. May repeat after 2 hours if needed. 2 Syringe 5   ??? temAZEpam (RESTORIL) 15 mg capsule Continuous.     ??? tranexamic acid 650 mg Tab tablet Take 1,300 mg by mouth.     ??? traZODone (DESYREL) 50 MG tablet Take 1-2 tablets (50-100 mg total) by mouth nightly at bedtime as needed for sleep. 60 tablet 3   ??? ursodioL (ACTIGALL) 300 mg capsule Take 1 capsule (300 mg total) by mouth Two (2) times a day. 60 capsule 5     No current facility-administered medications for this visit.        Allergies   Allergen Reactions   ??? Oxycontin [Oxycodone] Hives   ??? Shellfish Containing Products Hives and Swelling Patient Active Problem List   Diagnosis   ??? Grief reaction with prolonged bereavement   ??? Legally blind   ??? Migraine without aura and with status migrainosus, not intractable   ??? Peripheral polyneuropathy   ??? PTSD (post-traumatic stress disorder)   ??? Albuminuria   ??? Chronic midline back pain   ??? Non-cardiac chest pain   ??? Alternating exotropia with V pattern   ??? Ankle instability   ??? Derangement of posterior horn of medial meniscus   ??? Horizontal nystagmus   ??? Myopia of both eyes   ??? Oculocutaneous albinism type II (OCA2) (CMS-HCC)   ??? Onychocryptosis   ??? Patellofemoral pain syndrome of both knees   ??? Plica syndrome   ??? Sensorineural hearing loss (SNHL) of left ear with unrestricted hearing of right ear   ??? Sickle cell trait (CMS-HCC)   ??? Type 2 diabetes mellitus without complication, without long-term current use of insulin (CMS-HCC)   ??? Anxiety and depression   ??? Syncope   ???  Bipolar affective disorder, currently in remission (CMS-HCC)   ??? Morbid obesity with BMI of 50.0-59.9, adult (CMS-HCC)   ??? Constipation   ??? 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 Midline thoracic back pain       Reviewed and up to date in Epic.    Appropriateness of Therapy     Is medication and dose appropriate based on diagnosis? Yes    Baseline Quality of Life Assessment      How many days over the past month did your Bipolar affective disorder keep you from your normal activities? 0    Financial Information     Medication Assistance provided: None Required    Anticipated copay of $0 reviewed with patient. Verified delivery address.    Delivery Information     Scheduled delivery date: 12/12/18    Expected start date: next appt 01/17/19    Medication will be delivered via [facility] - [facility] clinic to the temporary address in [place].  This shipment will not require a signature. Explained the services we provide at [facility] and that each month we would call to set up refills.  Stressed importance of returning phone calls so that we could ensure they receive their medications in time each month.  Informed patient that we should be setting up refills 7-10 days prior to when they will run out of medication.  A pharmacist will reach out to perform a clinical assessment periodically.  Informed patient that a welcome packet and a drug information handout will be sent.      Patient verbalized understanding of the above information as well as how to contact the pharmacy at [phone] option 4 with any questions/concerns.  The pharmacy is open Monday through Friday 8:30am-4:30pm.  A pharmacist is available 24/7 via pager to answer any clinical questions they may have.    Patient Specific Needs     ? Does the patient have any physical, cognitive, or cultural barriers? No    ? Patient prefers to have medications discussed with  call clinic directly     ? Is the patient able to read and understand education materials at a high school level or above? Yes    ? Patient's primary language is  English     ? Is the patient high risk? No     ? Does the patient require a Care Management Plan? No     ? Does the patient require physician intervention or other additional services (i.e. nutrition, smoking cessation, social work)? No      Ashley Dunlap [facility] Specialty Pharmacist

## 2018-12-12 MED FILL — ABILIFY MAINTENA 400 MG INTRAMUSCULAR SUSPENSION,EXTENDED RELEASE: 28 days supply | Qty: 1 | Fill #0 | Status: AC

## 2018-12-12 MED FILL — ABILIFY MAINTENA 400 MG INTRAMUSCULAR SUSPENSION,EXTENDED RELEASE: INTRAMUSCULAR | 28 days supply | Qty: 1 | Fill #0

## 2018-12-18 NOTE — Unmapped (Signed)
12/18/18    Travel Screening Questions Completed.    Travel Screening Questions/Answers:  1). Have you traveled out of [place] within the last 14 days?: No  2). Do you have any of the following symptoms that are new or worsening: cough, shortness of breath, loss of taste/smell, sore throat, fever or feeling feverish, repeated shaking chills, muscle pain, vomiting, or diarrhea?: No  3). Have you had close contact with a person with confirmed COVID-19 in the last 21 days before symptoms began?: No  4). Have you tested positive in the last 21 days for COVID-19?: No      Negative Travel Screen: Patient answered NO to questions 2, 3, and 4. Offer Telephone visit, Virtual Visit or In Person Visit according to the current scheduling protocol for your clinic.       The call was handled in the following manner: Scheduled for In Person Visit

## 2018-12-18 NOTE — Unmapped (Signed)
TC received from patient s/p gastric bypass on 10-31-2018 with ED visit on 11/26/2018 for abdominal pain.  Patient endorsed small bowel movement since surgery.  Currently taking Miralax daily and Senna.   Patient advised to try a Fleets Suppository or Enema.  She will follow up she does not have a result.

## 2018-12-18 NOTE — Unmapped (Signed)
Recv'd Drug Utilization Review; placed in your box

## 2018-12-19 ENCOUNTER — Encounter
Admit: 2018-12-19 | Discharge: 2018-12-20 | Payer: MEDICARE | Attending: Obstetrics & Gynecology | Primary: Obstetrics & Gynecology

## 2018-12-19 DIAGNOSIS — Z3169 Encounter for other general counseling and advice on procreation: Principal | ICD-10-CM

## 2018-12-19 DIAGNOSIS — N939 Abnormal uterine and vaginal bleeding, unspecified: Principal | ICD-10-CM

## 2018-12-19 DIAGNOSIS — Z30017 Encounter for initial prescription of implantable subdermal contraceptive: Principal | ICD-10-CM

## 2018-12-19 DIAGNOSIS — Z975 Presence of (intrauterine) contraceptive device: Principal | ICD-10-CM

## 2018-12-19 MED ORDER — TRANEXAMIC ACID 650 MG TABLET: 1300 mg | tablet | Freq: Three times a day (TID) | 0 refills | 10 days | Status: AC

## 2018-12-19 NOTE — Unmapped (Signed)
Outpatient Gynecology Note: Return Visit    ASSESSMENT AND PLAN     Ashley Dunlap is a 28 y.o. G2P0020 who presents for the follow up of Abnormal Uterine Bleeding.    Abnormal uterine bleeding  - Likely AUB-E due to obesity (BMI 45)  - Soaking 10-12 pads for 7-10 days with irregular cycles (q1-77mo), H/H always stable (most recent in 10/2018)  - H/o heavy menses since menarche at 28yo with normal work-up   - 03/2018: TVUS normal anatomy   - 11/2016: EMB normal   - H/o normal prolactin/TSH at [facility]  - Previous contraceptives: Depo, Implanon, Mirena, cOCPs  - Current: reports provera and TXA with period every month, condoms previously, abstinence since gastric bypass  - Reports Implanon was only contraceptive that worked previously  - Given success with Implanon in past, discussed placement of Nexplanon today. Patient in agreement. Nexplanon placed without complication (see procedure note below)  - Plan for f/u in 6 month to assess satisfaction with Nexplanon     Contraception, device intrauterine  - Nexplanon placed today (as above)  - See procedure note below    Encounter for preconception consultation  - S/p MFM consultation for T2DM, ocular albinism with Dr. Laural Benes in 08/2017  - S/p Genetic Counselor appt on 09/2017  - S/p gastric bypass in 10/2018 with recent 60lb weight loss, goal is to get weight to 200lb prior to attempting to conceive  - Patient congratulated on recent weight loss and encouraged to continue to exercise, follow-up closely with her gastric bypass clinic  - Plan f/u in 6 months to assess progress toward weight loss goals    Routine Health Maintenance  - Pap UTD  - S/p Gardasil vaccine series  - S/p flu vaccine  - Breast exam performed today, normal    Return in about 6 months (around 06/19/2019) for Recheck with Lattie Corns or Korynne Dols (if possible).    Dr. Lattie Corns was available for the care of this patient.    HISTORY     CC: follow up     HPI: Ashley Dunlap presents for the follow up of AUB.     She reports that since her last encounter, she has not really experienced any change in her bleeding patterns, but wanted to discuss other options for management. She has had heavy,irregular menses since 28yo. Menses typically come approximately every 1-3 months and last for 7-10 days. On the heavy days, she soaks approximately 10-12 pads per day. These pads vary from panty liners to thick pads. She denies any significant pain or cramping with these periods. She has previously used Depo, Mirena, cOCPs without success. She had an Implanon approximately 2 years ago, that decreased her bleeding to spotting. She thinks that she may like to use this form of contraception again.     She reports that she had a gastric bypass surgery in 10/2018 and has lost 60lb. She desires future fertility, but wants to lose more weight first. She is currently around 300lb, and she is hoping to weigh less than 200 when she starts to try to conceive.     She is otherwise doing well. She is UTD with her pap smears. She previously received the Gardasil vaccine series. She already received her flu vaccine this year.     Review of Systems  A 12 system review of systems was negative except as noted in HPI    The following portions of the patient's history were reviewed and updated as appropriate: allergies,  current medications, past obstetric history, family history, past medical history, past social history, past surgical history and problem list.    PHYSICAL EXAM   BP 112/71  - Pulse 60  - Wt (!) 141.1 kg (311 lb)  - LMP 11/21/2018  - BMI 45.93 kg/m??   Body mass index is 45.93 kg/m??.     Constitutional: No distress.   Cardiovascular: Normal rate.    Pulmonary/Chest: Lungs clear to ausculation in all lung fields.   Breasts: breasts appear normal, no suspicious masses, no skin or nipple changes or axillary nodes. Abdominal: Bowel sounds present, Soft, not TTP, no guarding, no rigidity or no rebound. Laparoscopic incisions C/DI, healing well.   Adnexa/Parametria: No masses; no parametrial nodularity; no tenderness  Genitourinary:          External Genitalia: Normal female genitalia     Urethral Meatus: Normal caliber and position     Urethra: Midline, no masses     Bladder: Well-suspended, NT     Vagina: Well-rugated, no lesions.     Cervix: No lesions, normal size and consistency; no cervical motion tenderness      Uterus: Normal size and contour; smooth, mobile, nontender  Extremities: No edema or calf tenderness bilaterally.  Neurological: She is alert and oriented to person, place, and time.   Skin: Warm, dry, no rash  Neuro: Alert and conversational  Psychiatric: Normal mood and affect    NEXPLANON INSERTION PROCEDURE NOTE     The patient was counselled regarding contraceptive implant placement, specifically the side effects of irregular spotting for the full three years, the potential difficulty in removal, the need to palpate the implant after placement and intermittently over the next three years. The risks of the procedure were discussed with the patient including: bleeding, infection and damage to surrounding structures. A consent document was signed.     The patient was placed in supine position with the left flexed at the elbow and externally rotated.  A timeout was performed. The insertion site was identified: inner side of arm, overlying the triceps muscle, about 8-10 cm from the medial epicondyle of the humerus and 3-5 cm below the sulcus between the biceps and triceps muscles. The area was prepped with alcohol, and 5 mL of local anesthesia was given along the intended site of insertion. The area was then cleansed with betadine.  ?? The presence of Nexplanon inside the trochar was verified with the needle cap in place. ?? The trochar was then inserted at a 20 degree angle to enter the skin, and then lowered to a horizontal plane.    ?? Tenting the skin upward, the needle was gently inserted its full length.   ?? The implant was palpated at the insertion site and placement was assured.  The patient also palpated the Nexplanon.  ?? A dressing was placed and the patient tolerated the procedure well.     Contraception Counseling:   ?? Patient has not been on birth control. She knows to use backup contraception or abstain from intercourse for 7 days before the Nexplanon is deemed effective.  ?? Patient knows she will need to have her Nexplanon removed or replaced in three years.  ?? Patient was given the Nexplanon user card for her records.  ?? Patient was instructed to observe the site for bleeding, discharge, redness or swelling and to call if any of these occur. She was instructed to leave the pressure dressing on for 24 hours.     Dr. Lattie Corns was  available for the care of the patient.    LABS AND IMAGING     Lab Results   Component Value Date    WBC 3.9 (L) 11/26/2018    HGB 12.0 (L) 11/26/2018    HCT 37.8 11/26/2018    PLT 329 11/26/2018     Lab Results   Component Value Date    CREATININE 0.71 11/26/2018     No results found.

## 2018-12-19 NOTE — Unmapped (Addendum)
-   S/p MFM consultation for T2DM, ocular albinism with Dr. Laural Benes in 08/2017  - S/p Genetic Counselor appt on 09/2017  - S/p gastric bypass in 10/2018 with recent 60lb weight loss, goal is to get weight to 200lb prior to attempting to conceive  - Patient congratulated on recent weight loss and encouraged to continue to exercise, follow-up closely with her gastric bypass clinic  - Plan f/u in 6 months to assess progress toward weight loss goals

## 2018-12-19 NOTE — Unmapped (Signed)
-   Nexplanon placed today (as above)  - See procedure note below

## 2018-12-19 NOTE — Unmapped (Addendum)
-   Likely AUB-E due to obesity (BMI 45)  - Soaking 10-12 pads for 7-10 days with irregular cycles (q1-48mo), H/H always stable (most recent in 10/2018)  - H/o heavy menses since menarche at 28yo with normal work-up   - 03/2018: TVUS normal anatomy   - 11/2016: EMB normal   - H/o normal prolactin/TSH at [facility]  - Previous contraceptives: Depo, Implanon, Mirena, cOCPs  - Current: reports provera and TXA with period every month, condoms previously, abstinence since gastric bypass  - Reports Implanon was only contraceptive that worked previously  - Given success with Implanon in past, discussed placement of Nexplanon today. Patient in agreement. Nexplanon placed without complication (see procedure note below)  - Plan for f/u in 6 month to assess satisfaction with Nexplanon

## 2018-12-23 NOTE — Unmapped (Signed)
TC placed to patient regarding appointment scheduled with Dr. Ferd Glassing today.   Pt advised she is in [facility] to to three days of nausea, vomiting and diarrhea.    Dr. Ferd Glassing advised.  Appointment cancelled.

## 2018-12-25 NOTE — Unmapped (Signed)
VM left requesting return call

## 2018-12-27 DIAGNOSIS — R1310 Dysphagia, unspecified: Principal | ICD-10-CM

## 2018-12-27 DIAGNOSIS — Z9884 Bariatric surgery status: Principal | ICD-10-CM

## 2018-12-27 DIAGNOSIS — R112 Nausea with vomiting, unspecified: Principal | ICD-10-CM

## 2018-12-27 NOTE — Unmapped (Signed)
TC placed to advise patient Dr. Ferd Glassing recommends you have an endoscopy to assess your symptoms of dysphagia, nausea and vomiting.  Patient states her symptoms have lessened and she is tolerating po.  She requests order for endoscopy be placed.  She will decide if she will proceed at time of scheduling.   Orders placed.

## 2019-01-03 ENCOUNTER — Institutional Professional Consult (permissible substitution): Admit: 2019-01-03 | Discharge: 2019-01-04 | Payer: MEDICARE

## 2019-01-03 DIAGNOSIS — F3170 Bipolar disorder, currently in remission, most recent episode unspecified: Principal | ICD-10-CM

## 2019-01-03 DIAGNOSIS — F3181 Bipolar II disorder: Principal | ICD-10-CM

## 2019-01-03 MED ORDER — ARIPIPRAZOLE ER 400 MG INTRAMUSCULAR SUSPENSION,EXTENDED RELEASE
INTRAMUSCULAR | 0 refills | 0.00000 days | Status: CP
Start: 2019-01-03 — End: 2020-10-09

## 2019-01-03 NOTE — Unmapped (Signed)
Administered long-acting injectable today. See MAR for additional documentation. Kit Mollett, CMA

## 2019-01-08 DIAGNOSIS — F3170 Bipolar disorder, currently in remission, most recent episode unspecified: Principal | ICD-10-CM

## 2019-01-08 NOTE — Unmapped (Signed)
[facility] Refill Coordination Note    Specialty Medication(s) to be Shipped:   General Specialty: [name]    Other medication(s) to be shipped: none     Ashley Dunlap, DOB: 1990/09/08  Phone: [phone] (home) [phone] (work)      All above HIPAA information was verified with Ashley Dunlap     Completed refill call assessment today to schedule patient's medication shipment from the [facility] [phone]).       Specialty medication(s) and dose(s) confirmed: Regimen is correct and unchanged.   Changes to medications: NA  Changes to insurance: No  Questions for the pharmacist: No    Confirmed patient received Welcome Packet with first shipment. The patient will receive a drug information handout for each medication shipped and additional FDA Medication Guides as required.       DISEASE/MEDICATION-SPECIFIC INFORMATION        For patients on injectable medications: Patient currently has 0 doses left.  Next injection is scheduled for 01/17/19.    SPECIALTY MEDICATION ADHERENCE     Medication Adherence    Patient reported X missed doses in the last month: 0  Patient is on additional specialty medications: No  Patient is on more than two specialty medications: No  Any gaps in refill history greater than 2 weeks in the last 3 months: no  Demonstrates understanding of importance of adherence: no  Reliability of informant: reliable  Confirmed plan for next specialty medication refill: delivery by [facility]          Refill Coordination    Has the Patients' Contact Information Changed: No  Is the Shipping Address Different: No       Abilify Maintena 400 mg: 0 days of medicine on hand       SHIPPING     Shipping address confirmed in Epic.     Delivery Scheduled: Yes, Expected medication delivery date: 01/14/19.  However, Rx request for refills was sent to the provider as there are none remaining. Medication will be delivered via [facility] to the temporary address in Epic [place].    Ashley [facility]lap Specialty Pharmacist

## 2019-01-14 DIAGNOSIS — F3170 Bipolar disorder, currently in remission, most recent episode unspecified: Principal | ICD-10-CM

## 2019-01-14 NOTE — Unmapped (Signed)
Ashley Dunlap 's abilify maintena shipment will be delayed as a result of no refills remain on the prescription.      I have reached out to the patient and left a voicemail message.  We will wait for a call back from the patient to reschedule the delivery.  We via next day courier.

## 2019-01-15 MED ORDER — ABILIFY MAINTENA 400 MG INTRAMUSCULAR SUSPENSION,EXTENDED RELEASE
INTRAMUSCULAR | 0 refills | 672.00000 days | Status: CP
Start: 2019-01-15 — End: 2020-10-21
  Filled 2019-01-16: qty 1, 28d supply, fill #0

## 2019-01-15 NOTE — Unmapped (Signed)
[facility] Administered Medication Refill Coordination Note      NAME:Angel Talissa Apple DOB: April 22, 1990      Medication: Abilify Maintena  Day Supply: 28 days      SHIPPING      Next delivery from [facility] [phone]) to [facility] for Tomeka Yaeli Hartung is scheduled for 01/16/19.      Patient's Next Nurse Visit for administration: 01/17/19.    We will follow up with clinic monthly for standard refill processing and delivery.      Kermit Balo  Specialty Pharmacy Pharmacist

## 2019-01-16 ENCOUNTER — Encounter: Admit: 2019-01-16 | Discharge: 2019-01-17 | Payer: MEDICARE | Attending: Adult Health | Primary: Adult Health

## 2019-01-16 MED ORDER — METHOCARBAMOL 500 MG TABLET
ORAL_TABLET | Freq: Four times a day (QID) | ORAL | 0 refills | 7.00000 days | Status: CP | PRN
Start: 2019-01-16 — End: 2019-01-23

## 2019-01-16 MED ORDER — TRAMADOL 50 MG TABLET
ORAL_TABLET | Freq: Four times a day (QID) | ORAL | 0 refills | 5.00000 days | Status: CP | PRN
Start: 2019-01-16 — End: 2019-01-21

## 2019-01-16 MED FILL — ABILIFY MAINTENA 400 MG INTRAMUSCULAR SUSPENSION,EXTENDED RELEASE: 28 days supply | Qty: 1 | Fill #0 | Status: AC

## 2019-01-16 NOTE — Unmapped (Signed)
Telehealth Pre -Visit Note      01/16/19   PCP: Rodena Medin, MD  Encounter Provider: Arabella Merles, AGNP     Ashley [facility]lap is a 28 y.o. female    Chief Complaint   Patient presents with   ??? Back Pain       COVID-19 Travel Screening:                    Home Vitals  N/A or Unable to obtain    Allergies   Allergen Reactions   ??? Oxycontin [Oxycodone] Hives   ??? Shellfish Containing Products Hives and Swelling        Health Maintenance Due   Topic Date Due   ??? Retinal Eye Exam  05/26/2000       Healthcare Decision Maker:   HCDM (patient stated preference): Khilee, [place]ricks[place] - Mother - [phone]    Current Outpatient Medications on File Prior to Visit   Medication Sig Dispense Refill   ??? ARIPiprazole (ABILIFY MAINTENA) 400 mg SERR injection Inject 2 mL (400 mg total) into the muscle every twenty-eight (28) days for 24 doses. 24 each 0   ??? ARIPiprazole (ABILIFY) 1 mg/mL mL Take 10 mL (10 mg total) by mouth daily for 30 doses. 300 mL 2   ??? blood-glucose meter kit Disp. blood glucose meter kit preferred by patient's insurance. Check blood sugars as directed by provider. Dx: Diabetes, E11.9 1 each 11   ??? ergocalciferol (DRISDOL) 1,250 mcg (50,000 unit) capsule Take 1 capsule (50,000 Units total) by mouth once a week. 4 capsule 1   ??? FLUoxetine (PROZAC) 20 mg/5 mL (4 mg/mL) solution Take 10 mL (40 mg total) by mouth daily. 300 mL 2   ??? gabapentin (NEURONTIN) 300 MG capsule Take 2 capsules (600 mg total) by mouth Three (3) times a day. Open capsule, pour contents on top of food or in liquid 180 capsule 0   ??? lancets 31 gauge Misc 1 each by Miscellaneous route Four (4) times a day. DX code E11.9 100 each 3   ??? medroxyPROGESTERone (PROVERA) 10 MG tablet 2 tablets for 3 days, then 1 tablet. 30 tablet 0   ??? metFORMIN (GLUCOPHAGE) 1000 MG tablet Take 1 tablet (1,000 mg total) by mouth 2 (two) times a day with meals. For Diabetes 60 tablet 11 ??? omeprazole (PRILOSEC) 40 MG capsule Take 1 capsule (40 mg total) by mouth daily. Open capsule and sprinkle on food or mix with water 30 capsule 6   ??? polyethylene glycol (GLYCOLAX) 17 gram/dose powder Take 17 g by mouth daily. 255 g 0   ??? senna-docusate (PERICOLACE) 8.6-50 mg Take 1 tablet by mouth daily. 30 tablet 2   ??? SUMAtriptan succinate 6 mg/0.5 mL PnIj Use 1 injection to relieve migraine HA. May repeat after 2 hours if needed. 2 Syringe 5   ??? temAZEpam (RESTORIL) 15 mg capsule Continuous.     ??? tranexamic acid (LYSTEDA) 650 mg Tab tablet Take 2 tablets (1,300 mg total) by mouth Three (3) times a day. 60 tablet 0   ??? traZODone (DESYREL) 50 MG tablet Take 1-2 tablets (50-100 mg total) by mouth nightly at bedtime as needed for sleep. 60 tablet 3   ??? ursodioL (ACTIGALL) 300 mg capsule Take 1 capsule (300 mg total) by mouth Two (2) times a day. 60 capsule 5     Current Facility-Administered Medications on File Prior to Visit   Medication Dose Route Frequency Provider Last Rate Last Dose   ???  ARIPiprazole (ABILIFY MAINTENA) injection 400 mg  400 mg Intramuscular Q28 Days Beryle Lathe, MD   400 mg at 01/03/19 0941       Refills Requests Reviewed and and requested medications have been pended.    Screeners:           PHQ-9 PHQ-9 TOTAL SCORE   03/08/2018 4   09/25/2017 19   08/27/2017 20                 [facility]  [place] [facility] [place] [facility]  [address], [place], [place] [address] ??? Telephone [phone] ??? Fax [phone]  [email or web address]

## 2019-01-16 NOTE — Unmapped (Signed)
Phone Visit Note  This medical encounter was conducted virtually using [email or web address]  TeleHealth protocols.      I have identified myself to the patient and conveyed my credentials to [name]  Patient/proxy has provided verbal consent to proceed with this phone visit.  In case we get disconnected, patient's phone number is  [phone] (work)     In case of Emergency, patient's current location: Other: [address], [place]  Is there someone else in the room? No.       [name] is a 28 y.o. female requesting a telephone visit today regarding the following issues:    Assessment/Plan:      Problem List Items Addressed This Visit     None      Visit Diagnoses     Back pain, unspecified back location, unspecified back pain laterality, unspecified chronicity    -  Primary  Acute on chronic back pain. No red flag symptoms. Will given short course of robaxin (she is out of flexeril entirely) as well as tramadol. Encouraged to continue to take tylenol (unable to take ibuprofen). Reviewed nonpharm interventions including rest, stretches, <10minutes of heat/cold compress, avoiding twisting at the waist or heavy lifting. Handouts provided. If this acute exacerbation is not relieved after these interventions, to follow up with PCP in person to have more thorough assessment and possible pain contract. Educated regarding signs and symptoms to seek more emergent medical attention.       Relevant Medications    methocarbamoL (ROBAXIN) 500 MG tablet    traMADoL (ULTRAM) 50 mg tablet          Followup:  No follow-ups on file.    I spent 11 minutes on the phone with the patient. I spent an additional 2 minutes on pre- and post-visit activities. The patient was physically located in [place] or a state in which I am permitted to provide care. The patient and/or parent/guardian understood that s/he may incur co-pays and cost sharing, and agreed to the telemedicine visit. The visit was reasonable and appropriate under the circumstances given the patient's presentation at the time.    The patient and/or parent/guardian has been advised of the potential risks and limitations of this mode of treatment (including, but not limited to, the absence of in-person examination) and has agreed to be treated using telemedicine. The patient's/patient's family's questions regarding telemedicine have been answered.     If the visit was completed in an ambulatory setting, the patient and/or parent/guardian has also been advised to contact their provider???s office for worsening conditions, and seek emergency medical treatment and/or call 911 if the patient deems either necessary.         Coding: Medicare/Medicaid/All Commercial insurers: use traditional E/M codes for new or established patients. Document total time spent including pre and post.         Subjective:     HPI  [name] is a 28 y.o. female requesting a telephone visit to discuss the following issues:    Back pain  Burning stabbing pain in entire back (upper and lower)  >10/10; ongoing for years but is acutely exacerbated right now  Radiates down legs, both sides  Every position is painful, constant  Was given flexeril and tylenol, helped somewhat, eases off but doesn't go away  Does a lot of sitting and standing at work, been bad all day  Denies fevers, chills, sweats, n/v/d, weakness, n/t, loss of bowel or bladder control  Able to work, sleep,  complete ADLs but is painful all the time    ROS  As per HPI.      HISTORY:  I have reviewed the patient's problem list, current medications, and allergies and have updated/reconciled them as needed.      Objective: As part of this Telephone Visit, no in-person exam was conducted.

## 2019-01-16 NOTE — Unmapped (Signed)
Patient Education      Patient Education      Patient Education        Back Pain: Care Instructions  Your Care Instructions     Back pain has many possible causes. It is often related to problems with muscles and ligaments of the back. It may also be related to problems with the nerves, discs, or bones of the back. Moving, lifting, standing, sitting, or sleeping in an awkward way can strain the back. Sometimes you don't notice the injury until later. Arthritis is another common cause of back pain.  Although it may hurt a lot, back pain usually improves on its own within several weeks. Most people recover in 12 weeks or less. Using good home treatment and being careful not to stress your back can help you feel better sooner.  Follow-up care is a key part of your treatment and safety. Be sure to make and go to all appointments, and call your doctor if you are having problems. It's also a good idea to know your test results and keep a list of the medicines you take.  How can you care for yourself at home?  ?? Sit or lie in positions that are most comfortable and reduce your pain. Try one of these positions when you lie down:  ? Lie on your back with your knees bent and supported by large pillows.  ? Lie on the floor with your legs on the seat of a sofa or chair.  ? Lie on your side with your knees and hips bent and a pillow between your legs.  ? Lie on your stomach if it does not make pain worse.  ?? Do not sit up in bed, and avoid soft couches and twisted positions. Bed rest can help relieve pain at first, but it delays healing. Avoid bed rest after the first day of back pain.  ?? Change positions every 30 minutes. If you must sit for long periods of time, take breaks from sitting. Get up and walk around, or lie in a comfortable position.  ?? Try using a heating pad on a low or medium setting for 15 to 20 minutes every 2 or 3 hours. Try a warm shower in place of one session with the heating pad. ?? You can also try an ice pack for 10 to 15 minutes every 2 to 3 hours. Put a thin cloth between the ice pack and your skin.  ?? Take pain medicines exactly as directed.  ? If the doctor gave you a prescription medicine for pain, take it as prescribed.  ? If you are not taking a prescription pain medicine, ask your doctor if you can take an over-the-counter medicine.  ?? Take short walks several times a day. You can start with 5 to 10 minutes, 3 or 4 times a day, and work up to longer walks. Walk on level surfaces and avoid hills and stairs until your back is better.  ?? Return to work and other activities as soon as you can. Continued rest without activity is usually not good for your back.  ?? To prevent future back pain, do exercises to stretch and strengthen your back and stomach. Learn how to use good posture, safe lifting techniques, and proper body mechanics.  When should you call for help?   Call your doctor now or seek immediate medical care if:  ?? ?? You have new or worsening numbness in your legs.   ?? ?? You have  new or worsening weakness in your legs. (This could make it hard to stand up.)   ?? ?? You lose control of your bladder or bowels.   Watch closely for changes in your health, and be sure to contact your doctor if:  ?? ?? You have a fever, lose weight, or don't feel well.   ?? ?? You do not get better as expected.   Where can you learn more?  Go to [facility] at https://myuncchart.org  Select Patient Education under [employer]. Enter [phone] in the search box to learn more about Back Pain: Care Instructions.  Current as of: April 29, 2018??????????????????????????????Content Version: 12.6  ?? 2006-2020 Healthwise, Incorporated.   Care instructions adapted under license by [facility]. If you have questions about a medical condition or this instruction, always ask your healthcare professional. Healthwise, Incorporated disclaims any warranty or liability for your use of this information.         Low Back Pain: Exercises Introduction  Here are some examples of exercises for you to try. The exercises may be suggested for a condition or for rehabilitation. Start each exercise slowly. Ease off the exercises if you start to have pain.  You will be told when to start these exercises and which ones will work best for you.  How to do the exercises  Press-up   1. Lie on your stomach, supporting your body with your forearms.  2. Press your elbows down into the floor to raise your upper back. As you do this, relax your stomach muscles and allow your back to arch without using your back muscles. As your press up, do not let your hips or pelvis come off the floor.  3. Hold for 15 to 30 seconds, then relax.  4. Repeat 2 to 4 times.    Alternate arm and leg (bird dog) exercise   Do this exercise slowly. Try to keep your body straight at all times, and do not let one hip drop lower than the other.  1. Start on the floor, on your hands and knees.  2. Tighten your belly muscles.  3. Raise one leg off the floor, and hold it straight out behind you. Be careful not to let your hip drop down, because that will twist your trunk.  4. Hold for about 6 seconds, then lower your leg and switch to the other leg.  5. Repeat 8 to 12 times on each leg.  6. Over time, work up to holding for 10 to 30 seconds each time.  7. If you feel stable and secure with your leg raised, try raising the opposite arm straight out in front of you at the same time.    Knee-to-chest exercise   1. Lie on your back with your knees bent and your feet flat on the floor.  2. Bring one knee to your chest, keeping the other foot flat on the floor (or keeping the other leg straight, whichever feels better on your lower back).  3. Keep your lower back pressed to the floor. Hold for at least 15 to 30 seconds.  4. Relax, and lower the knee to the starting position.  5. Repeat with the other leg. Repeat 2 to 4 times with each leg. 6. To get more stretch, put your other leg flat on the floor while pulling your knee to your chest.    Curl-ups   1. Lie on the floor on your back with your knees bent at a 90-degree angle. Your  feet should be flat on the floor, about 12 inches from your buttocks.  2. Cross your arms over your chest. If this bothers your neck, try putting your hands behind your neck (not your head), with your elbows spread apart.  3. Slowly tighten your belly muscles and raise your shoulder blades off the floor.  4. Keep your head in line with your body, and do not press your chin to your chest.  5. Hold this position for 1 or 2 seconds, then slowly lower yourself back down to the floor.  6. Repeat 8 to 12 times.    Pelvic tilt exercise   1. Lie on your back with your knees bent.  2. Brace your stomach. This means to tighten your muscles by pulling in and imagining your belly button moving toward your spine. You should feel like your back is pressing to the floor and your hips and pelvis are rocking back.  3. Hold for about 6 seconds while you breathe smoothly.  4. Repeat 8 to 12 times.    Heel dig bridging   1. Lie on your back with both knees bent and your ankles bent so that only your heels are digging into the floor. Your knees should be bent about 90 degrees.  2. Then push your heels into the floor, squeeze your buttocks, and lift your hips off the floor until your shoulders, hips, and knees are all in a straight line.  3. Hold for about 6 seconds as you continue to breathe normally, and then slowly lower your hips back down to the floor and rest for up to 10 seconds.  4. Do 8 to 12 repetitions.    Hamstring stretch in doorway   1. Lie on your back in a doorway, with one leg through the open door.  2. Slide your leg up the wall to straighten your knee. You should feel a gentle stretch down the back of your leg. 3. Hold the stretch for at least 15 to 30 seconds. Do not arch your back, point your toes, or bend either knee. Keep one heel touching the floor and the other heel touching the wall.  4. Repeat with your other leg.  5. Do 2 to 4 times for each leg.    Hip flexor stretch   1. Kneel on the floor with one knee bent and one leg behind you. Place your forward knee over your foot. Keep your other knee touching the floor.  2. Slowly push your hips forward until you feel a stretch in the upper thigh of your rear leg.  3. Hold the stretch for at least 15 to 30 seconds. Repeat with your other leg.  4. Do 2 to 4 times on each side.    Wall sit   1. Stand with your back 10 to 12 inches away from a wall.  2. Lean into the wall until your back is flat against it.  3. Slowly slide down until your knees are slightly bent, pressing your lower back into the wall.  4. Hold for about 6 seconds, then slide back up the wall.  5. Repeat 8 to 12 times.    Follow-up care is a key part of your treatment and safety. Be sure to make and go to all appointments, and call your doctor if you are having problems. It's also a good idea to know your test results and keep a list of the medicines you take.  Where can you learn more?  Go  to [facility] at https://myuncchart.org  Select Patient Education under [employer]. Enter [phone] in the search box to learn more about Low Back Pain: Exercises.  Current as of: April 29, 2018??????????????????????????????Content Version: 12.6  ?? 2006-2020 Healthwise, Incorporated.   Care instructions adapted under license by [facility]. If you have questions about a medical condition or this instruction, always ask your healthcare professional. Healthwise, Incorporated disclaims any warranty or liability for your use of this information.         Back Stretches: Exercises  Introduction  Here are some examples of exercises for stretching your back. Start each exercise slowly. Ease off the exercise if you start to have pain. Your doctor or physical therapist will tell you when you can start these exercises and which ones will work best for you.  How to do the exercises  Overhead stretch   1. Stand comfortably with your feet shoulder-width apart.  2. Looking straight ahead, raise both arms over your head and reach toward the ceiling. Do not allow your head to tilt back.  3. Hold for 15 to 30 seconds, then lower your arms to your sides.  4. Repeat 2 to 4 times.    Side stretch   1. Stand comfortably with your feet shoulder-width apart.  2. Raise one arm over your head, and then lean to the other side.  3. Slide your hand down your leg as you let the weight of your arm gently stretch your side muscles. Hold for 15 to 30 seconds.  4. Repeat 2 to 4 times on each side.    Press-up   1. Lie on your stomach, supporting your body with your forearms.  2. Press your elbows down into the floor to raise your upper back. As you do this, relax your stomach muscles and allow your back to arch without using your back muscles. As your press up, do not let your hips or pelvis come off the floor.  3. Hold for 15 to 30 seconds, then relax.  4. Repeat 2 to 4 times.    Relax and rest   1. Lie on your back with a rolled towel under your neck and a pillow under your knees. Extend your arms comfortably to your sides.  2. Relax and breathe normally.  3. Remain in this position for about 10 minutes.  4. If you can, do this 2 or 3 times each day.    Follow-up care is a key part of your treatment and safety. Be sure to make and go to all appointments, and call your doctor if you are having problems. It's also a good idea to know your test results and keep a list of the medicines you take.  Where can you learn more?  Go to [facility] at https://myuncchart.org  Select Patient Education under [employer]. Enter [phone] in the search box to learn more about Back Stretches: Exercises.  Current as of: April 29, 2018??????????????????????????????Content Version: 12.6 ?? 2006-2020 Healthwise, Inc[employer].   Care instructions adapted under license by [facility]. If you have questions about a medical condition or this instruction, always ask your healthcare professional. Healthwise, Incorporated disclaims any warranty or liability for your use of this information.

## 2019-01-17 ENCOUNTER — Encounter: Admit: 2019-01-17 | Discharge: 2019-01-18 | Payer: MEDICARE

## 2019-01-17 DIAGNOSIS — G4700 Insomnia, unspecified: Principal | ICD-10-CM

## 2019-01-17 DIAGNOSIS — Z6841 Body Mass Index (BMI) 40.0 and over, adult: Secondary | ICD-10-CM

## 2019-01-17 DIAGNOSIS — F4310 Post-traumatic stress disorder, unspecified: Principal | ICD-10-CM

## 2019-01-17 DIAGNOSIS — F3170 Bipolar disorder, currently in remission, most recent episode unspecified: Principal | ICD-10-CM

## 2019-01-17 NOTE — Unmapped (Signed)
Follow-up instructions:  -- Please continue taking your medications as prescribed for your mental health.   -- Do not make changes to your medications, including taking more or less than prescribed, unless under the supervision of your physician. Be aware that some medications may make you feel worse if abruptly stopped  -- Please refrain from using illicit substances, as these can affect your mood and could cause anxiety or other concerning symptoms.   -- Seek further medical care for any increase in symptoms or new symptoms such as thoughts of wanting to hurt yourself or hurt others.     Contact info:  Life-threatening emergencies: call 911 or go to the nearest ER for medical or psychiatric attention.     Issues that need urgent attention but are not life threatening: call the outpatient clinic at [phone] for assistance.     Non-urgent routine concerns, questions, and refill requests: Please send me a message through MyChart and I will get back to you within 2 business days. Alternatively, leave me a voicemail at [phone] and I will get back to you within 2 business days.     Regarding appointments:  - If you need to cancel your appointment, we ask that you call [phone] at least 24 hours before your scheduled appointment.  - If for any reason you arrive 15 minutes later than your scheduled appointment time, you may not be seen and your visit may be rescheduled.  - Please remember that we will not automatically reschedule missed appointments.  - If you no show, arrive late, or cancel within 24 hours of the scheduled appointment three (3) times with an individual clinic or provider, you can be dismissed from the clinic and will likely be referred to a provider in your community.  - We will do our best to be on time. Sometimes an emergency will arise that might cause your clinician to be late. We will try to inform you of this when you check in for your appointment. If you wait more than 15 minutes past your appointment time without such notice, please speak with the front desk staff.    In the event of bad weather, the clinic staff will attempt to contact you, should your appointment need to be rescheduled. Additionally, you can call the Patient Weather Line [phone]) for system-wide clinic status    For more information and reminders regarding clinic policies (these were provided when you were admitted to the clinic), please ask the front desk.
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
## 2019-01-24 NOTE — Unmapped (Signed)
This was a telepsychiatry encounter. I was immediately available via phone/pager, but did not see the patient. I reviewed and discussed the case with the resident, but did not see the patient.  I agree with the assessment and plan as documented in the resident's note.     Solara Goodchild S Jobin Montelongo, MD

## 2019-01-28 ENCOUNTER — Encounter: Admit: 2019-01-28 | Discharge: 2019-01-28 | Payer: MEDICARE

## 2019-01-28 ENCOUNTER — Encounter
Admit: 2019-01-28 | Discharge: 2019-01-28 | Payer: MEDICARE | Attending: Student in an Organized Health Care Education/Training Program | Primary: Student in an Organized Health Care Education/Training Program

## 2019-01-28 DIAGNOSIS — M546 Pain in thoracic spine: Principal | ICD-10-CM

## 2019-01-28 DIAGNOSIS — R5383 Other fatigue: Principal | ICD-10-CM

## 2019-01-28 LAB — CBC
HEMATOCRIT: 39.5 % (ref 36.0–46.0)
HEMOGLOBIN: 11.9 g/dL — ABNORMAL LOW (ref 12.0–16.0)
MEAN CORPUSCULAR HEMOGLOBIN CONC: 30.2 g/dL — ABNORMAL LOW (ref 31.0–37.0)
MEAN CORPUSCULAR VOLUME: 95.8 fL (ref 80.0–100.0)
MEAN PLATELET VOLUME: 7.6 fL (ref 7.0–10.0)
RED BLOOD CELL COUNT: 4.12 10*12/L (ref 4.00–5.20)
RED CELL DISTRIBUTION WIDTH: 15.4 % — ABNORMAL HIGH (ref 12.0–15.0)
WBC ADJUSTED: 6.2 10*9/L (ref 4.5–11.0)

## 2019-01-28 LAB — THYROID STIMULATING HORMONE: Thyrotropin:ACnc:Pt:Ser/Plas:Qn:: 0.925

## 2019-01-28 LAB — MEAN PLATELET VOLUME: Platelet mean volume:EntVol:Pt:Bld:Qn:Automated count: 7.6

## 2019-01-28 MED ORDER — DICLOFENAC 1 % TOPICAL GEL
Freq: Four times a day (QID) | TOPICAL | 3 refills | 13.00000 days | Status: CP
Start: 2019-01-28 — End: 2020-01-28

## 2019-01-28 NOTE — Unmapped (Signed)
Patient Education        Back Stretches: Exercises  Introduction  Here are some examples of exercises for stretching your back. Start each exercise slowly. Ease off the exercise if you start to have pain.  Your doctor or physical therapist will tell you when you can start these exercises and which ones will work best for you.  How to do the exercises  Overhead stretch   1. Stand comfortably with your feet shoulder-width apart.  2. Looking straight ahead, raise both arms over your head and reach toward the ceiling. Do not allow your head to tilt back.  3. Hold for 15 to 30 seconds, then lower your arms to your sides.  4. Repeat 2 to 4 times.    Side stretch   1. Stand comfortably with your feet shoulder-width apart.  2. Raise one arm over your head, and then lean to the other side.  3. Slide your hand down your leg as you let the weight of your arm gently stretch your side muscles. Hold for 15 to 30 seconds.  4. Repeat 2 to 4 times on each side.    Press-up   1. Lie on your stomach, supporting your body with your forearms.  2. Press your elbows down into the floor to raise your upper back. As you do this, relax your stomach muscles and allow your back to arch without using your back muscles. As your press up, do not let your hips or pelvis come off the floor.  3. Hold for 15 to 30 seconds, then relax.  4. Repeat 2 to 4 times.    Relax and rest   1. Lie on your back with a rolled towel under your neck and a pillow under your knees. Extend your arms comfortably to your sides.  2. Relax and breathe normally.  3. Remain in this position for about 10 minutes.  4. If you can, do this 2 or 3 times each day.    Follow-up care is a key part of your treatment and safety. Be sure to make and go to all appointments, and call your doctor if you are having problems. It's also a good idea to know your test results and keep a list of the medicines you take.  Where can you learn more?  Go to [facility] at https://myuncchart.org Select Patient Education under [employer]. Enter [id number] in the search box to learn more about Back Stretches: Exercises.  Current as of: April 29, 2018??????????????????????????????Content Version: 12.7  ?? 2006-2020 Healthwise, Incorporated.   Care instructions adapted under license by [facility]. If you have questions about a medical condition or this instruction, always ask your healthcare professional. Healthwise, Incorporated disclaims any warranty or liability for your use of this information.

## 2019-01-28 NOTE — Unmapped (Signed)
1. Other fatigue    - CBC; Future  - TSH; Future    2. Midline thoracic back pain, unspecified chronicity    - diclofenac sodium (VOLTAREN) 1 % gel; Apply 2 g topically Four (4) times a day.  Dispense: 100 g; Refill: 3  - Ambulatory referral to [facility]; Future    3. DM hemoglobin A1c today     Follow up prn.     Chief Complaint   Patient presents with   ??? Establish Care   ??? Back Pain     lower back pain for many years. worsening due to current work.    ??? bariatric surgery     Sep 3rd. Hx of  iron deficiency which has possibly worsened with surgery. c/o farigue, cold feelings.        HPI: has low iron after bariatric surgery. Had surgery on September 3rd. Has not been feeling. Has been fatigued and very cold. Doing follow up with bariatric surgery in three months. Has been taking bariatric vitamin. Fatigue started three to four weeks ago. Fatigue is getting worse. Woke up sweaty and did not feel well.     Back pain - ongoing for years. Had weight loss surgery partially for this. Lifting 60 pound boxes daily. Starts at her neck and goes all the way down and into legs sometimes. Nothing really helps. Muscle relaxants help at night. Tramadol does not really help. Lidocaine does not seem to help. Has also done physical therapy for her back which did not help. Seemed to make it worse.     Blood sugars have been in the 90's. Very steady and feels good. Has been taken off all DM medications and would like to have hemoglobin A1c checked.   Review of Systems   Constitutional: Positive for malaise/fatigue and weight loss. Negative for chills and fever.   Respiratory: Negative for cough, shortness of breath and wheezing.    Cardiovascular: Negative for chest pain, palpitations and leg swelling.   Gastrointestinal: Negative for abdominal pain, heartburn, nausea and vomiting. The following portions of the patient's history were reviewed and updated as appropriate: allergies, current medications, past family history, past medical history, past social history, past surgical history and problem list.      Observations:  Vitals:    01/28/19 1519   BP: 116/83   Pulse: 71   Temp: 36.4 ??C (97.5 ??F)       Physical Exam  Vitals signs reviewed.   Cardiovascular:      Rate and Rhythm: Normal rate.      Heart sounds: No murmur. No friction rub. No gallop.    Pulmonary:      Effort: Pulmonary effort is normal. No respiratory distress.      Breath sounds: No wheezing or rales.   Neurological:      Mental Status: She is alert.         Marland Kitchen

## 2019-02-06 NOTE — Unmapped (Signed)
[facility] Administered Medication Refill Coordination Note      NAME:Nikeshia Steffie Waggoner DOB: Jan 30, 1991      Medication: Abilify Maintena  Day Supply: 28 days      SHIPPING      Next delivery from [facility] [phone]) to [facility] for Sophiagrace Caileigh Canche is scheduled for 02/12/19.      Patient's Next Nurse Visit for administration: unknown.    We will follow up with clinic monthly for standard refill processing and delivery.      Kermit Balo  Specialty Pharmacy Pharmacist

## 2019-02-12 MED FILL — ABILIFY MAINTENA 400 MG INTRAMUSCULAR SUSPENSION,EXTENDED RELEASE: INTRAMUSCULAR | 28 days supply | Qty: 1 | Fill #1

## 2019-02-12 MED FILL — ABILIFY MAINTENA 400 MG INTRAMUSCULAR SUSPENSION,EXTENDED RELEASE: 28 days supply | Qty: 1 | Fill #1 | Status: AC

## 2019-02-14 ENCOUNTER — Encounter: Admit: 2019-02-14 | Discharge: 2019-02-15 | Payer: MEDICARE

## 2019-02-14 MED ADMIN — ARIPiprazole (ABILIFY MAINTENA) injection 400 mg: 400 mg | INTRAMUSCULAR | @ 14:00:00 | Stop: 2026-08-06

## 2019-02-14 NOTE — Unmapped (Signed)
Patient came in today for injection only visit.  Vitals are within normal limit.  See MAR for injection details.

## 2019-02-17 ENCOUNTER — Encounter: Admit: 2019-02-17 | Discharge: 2019-02-18 | Payer: MEDICARE

## 2019-02-17 DIAGNOSIS — R42 Dizziness and giddiness: Principal | ICD-10-CM

## 2019-02-17 NOTE — Unmapped (Signed)
Spoke to patient, she said while waiting in line to clock in at work, she felt faint, she braced her self onto the wall and slid down to the floor, no injury sustained. As per patient, she did not lose consciousness.  Patient said she ate breakfast but believes that her B/P was low Patient stated that she feels fine now, she did not think the occurrence  warranted going to the ER, she just wants to get checked out. She confirmed that she's not home alone and will have her aunt drive her to the office for an appointment. Offered patient an appointment today with Dr. Oneida Arenas and she accepted.

## 2019-02-17 NOTE — Unmapped (Signed)
Patient is calling in regards to having a fainting episode at work today. Patient states that is the first episode in the last two months. Please call back asap

## 2019-02-17 NOTE — Unmapped (Signed)
ASSESSMENT/PLAN:    Problem List Items Addressed This Visit     None      Visit Diagnoses     Lightheaded    -  Primary  Likely presyncope brought on by stress or decreased PO intake 2/2 bariatric surgery restrictions. Orthostatics were normal today. No alarm s/sx whatsoever. Labs on 12/1 were unremarkable including a basically normal Hgb, normal TSH and electrolytes. F/U PRN.            Chief Complaint   Patient presents with   ??? Near Syncope     fainted at work today and wants to know whats going on. no other concerns today        SUBJECTIVE:    Ms. Bunte is a 28 y.o. female that presents to clinic today regarding the following issues:    Felt lightheaded and dizzy today at work, couldn't focus eyes on anything, leaned up against wall and slid down.  Also had a hot flash sensation.  Had a headache this morning before leaving for work. Took Tylenol 1000 mg po once.   No LOC. Did not hit head. No jerking movements.  No CP, SOB, numbness, tingling, weakness, leg swelling.  Patient reports feeling stressed at work, works at [employer]. Cold inside.  Eating and drinking but can only eat and drink so much because she had bariatric surgery in Sept 2019.    I have reviewed the patients problem list, current medications, allergies, family history and social history and updated them as needed.    Ms. Eisman  reports that she quit smoking about 2 years ago. Her smoking use included cigarettes. She has a 0.50 pack-year smoking history. She has never used smokeless tobacco.    A 10-point review of systems was completed and negative except noted as above.    OBJECTIVE:    Ms. Marsee  height is 175.3 cm (5' 9) and weight is 126.7 kg (279 lb 6.4 oz) (abnormal). Her temporal temperature is 36.7 ??C (98 ??F).   Gen: Pleasant woman in NAD  EENT: PERRLA, EOMI  CV: Normal S1 S2 no m/r/g  Resp: CTAB  Msk: No c/c/e  Neuro: A&O x 3, CN II-XII intact, gait normal      PHQ-9 PHQ-9 TOTAL SCORE   02/17/2019 0   03/08/2018 4 09/25/2017 19   08/27/2017 20     GAD7 Total Score GAD-7 Total Score   09/25/2017 10     Results for MIATA, CULBRETH (MRN [id number]) as of 02/17/2019 15:49   Ref. Range 01/28/2019 15:34 01/28/2019 16:04   WBC Latest Ref Range: 4.5 - 11.0 10*9/L  6.2   RBC Latest Ref Range: 4.00 - 5.20 10*12/L  4.12   HGB Latest Ref Range: 12.0 - 16.0 g/dL  27.2 (L)   HCT Latest Ref Range: 36.0 - 46.0 %  39.5   MCV Latest Ref Range: 80.0 - 100.0 fL  95.8   MCH Latest Ref Range: 26.0 - 34.0 pg  28.9   MCHC Latest Ref Range: 31.0 - 37.0 g/dL  53.6 (L)   RDW Latest Ref Range: 12.0 - 15.0 %  15.4 (H)   MPV Latest Ref Range: 7.0 - 10.0 fL  7.6   Platelet Latest Ref Range: 150 - 440 10*9/L  404   HGB A1C, RAP/PDS Latest Ref Range: <7.0 % 5.1    EST AVG GLU/PDS Latest Units: mg/dL 644    TSH Latest Ref Range: 0.600 - 3.300 uIU/mL  0.925  Assia Meanor Dayton Scrape, MD, MPH  [facility] at [facility]

## 2019-02-17 NOTE — Unmapped (Signed)
Patient Education        Lightheadedness or Faintness: Care Instructions  Your Care Instructions  Lightheadedness is a feeling that you are about to faint or pass out. You do not feel as if you or your surroundings are moving. It is different from vertigo, which is the feeling that you or things around you are spinning or tilting.  Lightheadedness usually goes away or gets better when you lie down. If lightheadedness gets worse, it can lead to a fainting spell.  It is common to feel lightheaded from time to time. Lightheadedness usually is not caused by a serious problem. It often is caused by a short-lasting drop in blood pressure and blood flow to your head that occurs when you get up too quickly from a seated or lying position.  Follow-up care is a key part of your treatment and safety. Be sure to make and go to all appointments, and call your doctor if you are having problems. It's also a good idea to know your test results and keep a list of the medicines you take.  How can you care for yourself at home?  ?? Lie down for 1 or 2 minutes when you feel lightheaded. After lying down, sit up slowly and remain sitting for 1 to 2 minutes before slowly standing up.  ?? Avoid movements, positions, or activities that have made you lightheaded in the past.  ?? Get plenty of rest, especially if you have a cold or flu, which can cause lightheadedness.  ?? Make sure you drink plenty of fluids, especially if you have a fever or have been sweating.  ?? Do not drive or put yourself and others in danger while you feel lightheaded.  When should you call for help?   Call 911 anytime you think you may need emergency care. For example, call if:  ?? ?? You have symptoms of a stroke. These may include:  ? Sudden numbness, tingling, weakness, or loss of movement in your face, arm, or leg, especially on only one side of your body.  ? Sudden vision changes.  ? Sudden trouble speaking. ? Sudden confusion or trouble understanding simple statements.  ? Sudden problems with walking or balance.  ? A sudden, severe headache that is different from past headaches.   ?? ?? You have symptoms of a heart attack. These may include:  ? Chest pain or pressure, or a strange feeling in the chest.  ? Sweating.  ? Shortness of breath.  ? Nausea or vomiting.  ? Pain, pressure, or a strange feeling in the back, neck, jaw, or upper belly or in one or both shoulders or arms.  ? Lightheadedness or sudden weakness.  ? A fast or irregular heartbeat.  After you call 911, the operator may tell you to chew 1 adult-strength or 2 to 4 low-dose aspirin. Wait for an ambulance. Do not try to drive yourself.   Watch closely for changes in your health, and be sure to contact your doctor if:  ?? ?? Your lightheadedness gets worse or does not get better with home care.   Where can you learn more?  Go to [facility] at https://myuncchart.org  Select Patient Education under [employer]. Enter [id number] in the search box to learn more about Lightheadedness or Faintness: Care Instructions.  Current as of: April 24, 2018??????????????????????????????Content Version: 12.7  ?? 2006-2020 Healthwise, Inc[employer].   Care instructions adapted under license by [facility]. If you have questions about a medical condition or this  instruction, always ask your healthcare professional. Healthwise, [employer] disclaims any warranty or liability for your use of this information.

## 2019-02-20 NOTE — Unmapped (Signed)
Error

## 2019-03-12 NOTE — Unmapped (Signed)
[facility] Administered Medication Refill Coordination Note      NAME:Ashley Dunlap DOB: [birth year]      Medication: Abilify Maintena  Day Supply: 28 days      SHIPPING      Next delivery from [facility] [phone]) to [facility] for Ashley Dunlap is scheduled for NA - pt has a dose in clinic.    Clinic contact: Jolene Schimke     Patient's next nurse visit for administration: 03/14/19.    We will follow up with clinic monthly for standard refill processing and delivery.      Kermit Balo  Specialty Pharmacy Pharmacist

## 2019-03-14 ENCOUNTER — Encounter: Admit: 2019-03-14 | Discharge: 2019-03-15 | Payer: MEDICARE

## 2019-03-14 DIAGNOSIS — F3170 Bipolar disorder, currently in remission, most recent episode unspecified: Principal | ICD-10-CM

## 2019-03-14 NOTE — Unmapped (Signed)
Injection given; V/S within normal limits  See MAR for details    PHQ-2 completed; negative

## 2019-03-26 NOTE — Unmapped (Signed)
[facility] Administered Medication Refill Coordination Note      NAME:Ashley Dunlap DOB: 08/21/1990      Medication: Abilify Maintena  Day Supply: 28 days      SHIPPING      Next delivery from [facility] [phone]) to [facility] for Ashley Dunlap is scheduled for 03/28/19.    Clinic contact:  [facility] Marylene Land Canty-[facility]    Patient's next nurse visit for administration: ~04/11/19    We will follow up with clinic monthly for standard refill processing and delivery.      Kermit Balo  Specialty Pharmacy Pharmacist

## 2019-03-28 MED FILL — ABILIFY MAINTENA 400 MG INTRAMUSCULAR SUSPENSION,EXTENDED RELEASE: INTRAMUSCULAR | 28 days supply | Qty: 1 | Fill #2

## 2019-03-28 MED FILL — ABILIFY MAINTENA 400 MG INTRAMUSCULAR SUSPENSION,EXTENDED RELEASE: 28 days supply | Qty: 1 | Fill #2 | Status: AC

## 2019-03-31 ENCOUNTER — Encounter: Admit: 2019-03-31 | Discharge: 2019-04-01 | Payer: MEDICARE

## 2019-03-31 DIAGNOSIS — R109 Unspecified abdominal pain: Principal | ICD-10-CM

## 2019-03-31 DIAGNOSIS — K5900 Constipation, unspecified: Principal | ICD-10-CM

## 2019-03-31 MED ORDER — POLYETHYLENE GLYCOL 3350 17 GRAM/DOSE ORAL POWDER
Freq: Every day | ORAL | 0 refills | 30 days | Status: CP
Start: 2019-03-31 — End: 2019-04-30

## 2019-03-31 NOTE — Unmapped (Signed)
ASSESSMENT/PLAN:    Problem List Items Addressed This Visit     None      Visit Diagnoses     Abdominal pain, unspecified abdominal location    -  Primary  Likely chronic constipation. Less concerned about acute pathology such as appendicitis, pancreatitis etc. Also consider complication from bariatric surgery although nothing on history or exam to suggest this specifically.  Recommend starting bowel regimen. Also recommend that she discuss this with her nutritionist how to incorporate more fiber within the limitations of her diet. Counseled about reasons to return or seek emergent medical attention.  F/U PRN.    Relevant Orders    XR Abdomen 1 View (KUB)            Chief Complaint   Patient presents with   ??? Abdominal Pain       SUBJECTIVE:    Ms. Henckel is a 29 y.o. female that presents to clinic today regarding the following issues:    Abdominal pain x 1 month, intermittent, 6/10, stabbing pain, comes on suddenly, lasts about 15-20 min at a time, laying down makes it better.   Moves bowels once per week, has to strain. Bright red blood intermittently on toilet paper when she wipes.    No fever, rashes, n/v, diarrhea.   Bariatric surgery in September and has been consistently losing weight.  Appetite is normal but not allowed to eat a large volume 2/2 surgery. Eats smaller meals, every four hours. Also taking vitamins as directed.    I have reviewed the patients problem list, current medications, allergies, family history and social history and updated them as needed.    Ms. Tieszen  reports that she quit smoking about 2 years ago. Her smoking use included cigarettes. She has a 0.50 pack-year smoking history. She has never used smokeless tobacco.    A 10-point review of systems was completed and negative except noted as above.    OBJECTIVE:    Ms. Coventry  height is 175.3 cm (5' 9.02) and weight is 117.7 kg (259 lb 6.4 oz) (abnormal). Her temporal temperature is 36.5 ??C (97.7 ??F). Her blood pressure is 105/72 and her pulse is 67.   Gen: Pleasant woman in NAD  EENT: horizontal b/l nystagmus at rest  Resp: normal respiratory effort  GI: well-healing surgical scars, soft, TTP in all quadrants, no rebound or guarding or masses  Msk: No c/c/e  Neuro: A&O x 3, gait normal      PHQ-9 PHQ-9 TOTAL SCORE   02/17/2019 0   03/08/2018 4   09/25/2017 19   08/27/2017 20     GAD7 Total Score GAD-7 Total Score   09/25/2017 10         Gregg Winchell Mieses Malchuk, MD, MPH  [facility] at [facility]

## 2019-04-01 NOTE — Unmapped (Addendum)
Once someone is constipated, it can take 1-2 months of using oral medications such as stool softeners to get your bowels back to normal.  You should use a good bowel regimen daily.  You may benefit from the following regimen:  - miralax 1 capful 2 times a day  - senna 2 tablets at night time  - colace 100mg  twice a day    If you start to have loose stools, stop the nightly senna and contact your doctor for other recommendations.    In addition:  - Eat lots of fiber and drink plenty of fluids

## 2019-04-10 NOTE — Unmapped (Signed)
Recent:   What is the date of your last related visit?  n/a  Related acute medications Rx'd:  n/a  Home treatment tried:  Tylenol, 2 at 0300    Relevant:   Allergies: Oxycontin [oxycodone] and Shellfish containing products  Medications: n/a  Health History: migraines  Weight: n/a        Reason for Disposition  ??? [1] MODERATE headache (e.g., interferes with normal activities) AND [2] present > 24 hours AND [3] unexplained  (Exceptions: analgesics not tried, typical migraine, or headache part of viral illness)    Answer Assessment - Initial Assessment Questions  1. LOCATION: Where does it hurt?       frontal and behind both eyes  2. ONSET: When did the headache start? (Minutes, hours or days)      3 days ago  3. PATTERN: Does the pain come and go, or has it been constant since it started?      constant, worsening last night  4. SEVERITY: How bad is the pain? and What does it keep you from doing?  (e.g., Scale 1-10; mild, moderate, or severe)    - MILD (1-3): doesn't interfere with normal activities     - MODERATE (4-7): interferes with normal activities or awakens from sleep     - SEVERE (8-10): excruciating pain, unable to do any normal activities        5/10 affecting my vision, a little blurry  5. RECURRENT SYMPTOM: Have you ever had headaches before? If so, ask: When was the last time? and What happened that time?      yes, last time was 2 months ago, I took Tylenol until it went away  6. CAUSE: What do you think is causing the headache?      I don't know  7. MIGRAINE: Have you been diagnosed with migraine headaches? If so, ask: Is this headache similar?      yes, this is similar takes no migraine medication per pt  8. HEAD INJURY: Has there been any recent injury to the head?      no  9. OTHER SYMPTOMS: Do you have any other symptoms? (fever, stiff neck, eye pain, sore throat, cold symptoms)      a little blurred vision, both eye pain  10. PREGNANCY: Is there any chance you are pregnant? When was your last menstrual period?       no, lmp-last week  Took 2 tylenol at 0300 and did not help much. Pt stated she will call pcp office at 0800 today and make an appointment, and if no appointment available today, she will go to UC. She stated she has no other questions and will call back if worsens, concerns, new symptoms.    Protocols used: HEADACHE-A-AH

## 2019-04-14 ENCOUNTER — Encounter
Admit: 2019-04-14 | Discharge: 2019-04-15 | Payer: MEDICARE | Attending: Obstetrics & Gynecology | Primary: Obstetrics & Gynecology
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
## 2019-04-15 DIAGNOSIS — B9689 Other specified bacterial agents as the cause of diseases classified elsewhere: Principal | ICD-10-CM

## 2019-04-15 DIAGNOSIS — N760 Acute vaginitis: Principal | ICD-10-CM

## 2019-04-15 MED ORDER — METRONIDAZOLE 500 MG TABLET
ORAL_TABLET | Freq: Two times a day (BID) | ORAL | 0 refills | 7 days | Status: CP
Start: 2019-04-15 — End: 2019-04-22

## 2019-04-16 NOTE — Unmapped (Signed)
Called to inform patient that her wet prep was positive for BV and her provider has written her an Rx for Flagyl.   Patient verbalized understanding.

## 2019-04-21 NOTE — Unmapped (Signed)
This patient is receiving Abilify Maintena as a clinic administered medication. Patient is aware of copay of $0. The patient prefers all future communication to occur with the clinic. This patient is being disenrolled from our specialty management program and will be added to a patient list for appropriate follow up.    Regis Bill, PharmD, BCGP, CSP  [facility]  [address]  [place], [place] [address]  ph: [phone]  f: [phone]

## 2019-04-29 ENCOUNTER — Encounter: Admit: 2019-04-29 | Discharge: 2019-04-30 | Payer: MEDICARE | Attending: Family | Primary: Family

## 2019-04-29 NOTE — Unmapped (Signed)
COVID Pre-Procedure Intake Form     Assessment     Ashley Dunlap is a 29 y.o. female presenting to [facility] for COVID testing.     Plan     If no testing performed, pt counseled on routine care for respiratory illness.  If testing performed, COVID sent.  Patient directed to Home given findings during today's visit.    Subjective     Ashley Dunlap is a 29 y.o. female who [facility] the [facility] with complaints of the following:    Exposure History: In the last 21 days?     Have you traveled outside of [place]? No               Have you been in close contact with someone confirmed by a test to have COVID? (Close contact is within 6 feet for at least 10 minutes) No       Have you worked in a health care facility? No     Lived or worked facility like a nursing home, group home, or assisted living?    No         Are you scheduled to have surgery or a procedure in the next 3 days? Yes               Are you scheduled to receive cancer chemotherapy within the next 7 days?    No     Have you ever been tested before for COVID-19 with a swab of your nose? No   Are you a healthcare worker being tested so to return to work No     Right now,  do you have any of the following that developed over the past 7 days (as stated by patient on intake form):    Subjective fever (felt feverish) No   Chills (especially repeated shaking chills) No   Severe fatigue (felt very tired) No   Muscle aches No   Runny nose No   Sore throat No   Loss of taste or smell No   Cough (new onset or worsening of chronic cough) No   Shortness of breath No   Nausea or vomiting No   Headache No   Abdominal Pain No   Diarrhea (3 or more loose stools in last 24 hours) No       Scribe's Attestation: Horst Ostermiller L. Lorin Picket, FNP obtained and performed the history, physical exam and medical decision making elements that were entered into the chart.  Signed by Teola Bradley serving as Scribe, on 04/29/2019 10:54 AM      The documentation recorded by the scribe accurately reflects the service I personally performed and the decisions made by me. Aida Puffer, FNP  April 30, 2019 12:01 PM

## 2019-04-30 NOTE — Unmapped (Signed)
03/03-Ashley Dunlap from  [facility] clinic states pt does not need Abilify @ this time,will follow-up in 3 weeks-CB

## 2019-05-02 ENCOUNTER — Encounter: Admit: 2019-05-02 | Discharge: 2019-05-02 | Payer: MEDICARE | Attending: Anesthesiology | Primary: Anesthesiology

## 2019-05-02 ENCOUNTER — Ambulatory Visit: Admit: 2019-05-02 | Discharge: 2019-05-02 | Payer: MEDICARE

## 2019-05-02 ENCOUNTER — Encounter: Admit: 2019-05-02 | Discharge: 2019-05-03 | Payer: MEDICARE

## 2019-05-02 DIAGNOSIS — F329 Major depressive disorder, single episode, unspecified: Principal | ICD-10-CM

## 2019-05-02 DIAGNOSIS — F419 Anxiety disorder, unspecified: Principal | ICD-10-CM

## 2019-05-02 DIAGNOSIS — F3170 Bipolar disorder, currently in remission, most recent episode unspecified: Principal | ICD-10-CM

## 2019-05-02 NOTE — Unmapped (Signed)
PHQ-2 completed; negative    Injection given; V/S within normal limits  See MAR for details

## 2019-05-14 NOTE — Unmapped (Signed)
[facility] Administered Medication Refill Coordination Note      NAME:Ashley Dunlap DOB: May 07, 1990      Medication: Abilify Maintena  Day Supply: 28 days      SHIPPING      Next delivery from [facility] [phone]) to [facility] for Ashley Dunlap is scheduled for 03/31.    Clinic contact: [facility]    Patient's next nurse visit for administration: 04/02.    We will follow up with clinic monthly for standard refill processing and delivery.      Dylann Layne Samella Parr  Specialty Pharmacy Technician

## 2019-05-22 NOTE — Unmapped (Signed)
Per patient she is no longer taking

## 2019-05-28 MED FILL — ABILIFY MAINTENA 400 MG INTRAMUSCULAR SUSPENSION,EXTENDED RELEASE: INTRAMUSCULAR | 28 days supply | Qty: 1 | Fill #3

## 2019-05-28 MED FILL — ABILIFY MAINTENA 400 MG INTRAMUSCULAR SUSPENSION,EXTENDED RELEASE: 28 days supply | Qty: 1 | Fill #3 | Status: AC

## 2019-06-18 NOTE — Unmapped (Signed)
04/21-per Marylene Land Canty-Montgomery @ [facility] states pt still has dose of Abilify still in clinic with no appointment scheduled @ this time will follow-up in  3 weeks  -CB

## 2019-06-25 ENCOUNTER — Encounter: Admit: 2019-06-25 | Discharge: 2019-06-25 | Payer: MEDICARE

## 2019-06-25 DIAGNOSIS — R110 Nausea: Principal | ICD-10-CM

## 2019-06-25 MED ORDER — SCOPOLAMINE 1 MG OVER 3 DAYS TRANSDERMAL PATCH
MEDICATED_PATCH | TRANSDERMAL | 1 refills | 12.00000 days | Status: CP
Start: 2019-06-25 — End: 2020-06-24

## 2019-06-25 NOTE — Unmapped (Signed)
Patient reports she would like a virtual visit to see a provider.  She's been having nausea, vomiting and headache since Sunday.  She denies being exposed to Covid and would like to speak to a provider prior to being tested.  Appointment scheduled for virtual visit.

## 2019-06-25 NOTE — Unmapped (Signed)
Phone Visit Note  This medical encounter was conducted virtually using [email or web address]  TeleHealth protocols.      I have identified myself to the patient and conveyed my credentials to [name]  Patient/proxy has provided verbal consent to proceed with this phone visit.  In case we get disconnected, patient's phone number is [phone] (home) [phone] (work)  In case of Emergency, patient's current location: Home: [address]  [place] [address]  Is there someone else in the room? No.       [name] is a 29 y.o. female requesting a telephone visit today regarding the following issues:    Assessment/Plan:      Problem List Items Addressed This Visit     None      Visit Diagnoses     Nausea    -  Primary    Relevant Medications    scopolamine (TRANSDERM-SCOP) 1 mg over 3 days        Will request covid testing as well as would be candidate for monoclonal antibodies.     Followup:  No follow-ups on file.        I spent 12 minutes on the phone with the patient on the date of service. I spent an additional 2 minutes on pre- and post-visit activities on the date of service.     The patient was physically located in [place] or a state in which I am permitted to provide care. The patient and/or parent/guardian understood that s/he may incur co-pays and cost sharing, and agreed to the telemedicine visit. The visit was reasonable and appropriate under the circumstances given the patient's presentation at the time.    The patient and/or parent/guardian has been advised of the potential risks and limitations of this mode of treatment (including, but not limited to, the absence of in-person examination) and has agreed to be treated using telemedicine. The patient's/patient's family's questions regarding telemedicine have been answered.     If the visit was completed in an ambulatory setting, the patient and/or parent/guardian has also been advised to contact their provider???s office for worsening conditions, and seek emergency medical treatment and/or call 911 if the patient deems either necessary.             Subjective:     HPI  Ashley Dunlap is a 29 y.o. female requesting a telephone visit to discuss the following issues:    Has been having nausea, vomiting and headaches since Sunday. Has had a low grade fever. No cough or shortness of breath. No diarrhea. Nothing seems to help. Staying the same. Has not tried anything. Has not tried anything for pain. Reports able to void and taking sips of fluids. Blood sugars have been normal. Has gotten a covid vaccine 2 weeks ago (first one). Has not been around anyone else who has been sick. Would like to have a prescription for scopolamine patch.     ROS  As per HPI.      HISTORY:  I have reviewed the patient's problem list, current medications, and allergies and have updated/reconciled them as needed.      Objective:     As part of this Telephone Visit, no in-person exam was conducted.

## 2019-06-25 NOTE — Unmapped (Signed)
Patient Education        Nausea and Vomiting: Care Instructions  Your Care Instructions     When you are nauseated, you may feel weak and sweaty and notice a lot of saliva in your mouth. Nausea often leads to vomiting. Most of the time you do not need to worry about nausea and vomiting, but they can be signs of other illnesses.  Two common causes of nausea and vomiting are stomach flu and food poisoning. Nausea and vomiting from viral stomach flu will usually start to improve within 24 hours. Nausea and vomiting from food poisoning may last from 12 to 48 hours.  The doctor has checked you carefully, but problems can develop later. If you notice any problems or new symptoms, get medical treatment right away.  Follow-up care is a key part of your treatment and safety. Be sure to make and go to all appointments, and call your doctor if you are having problems. It's also a good idea to know your test results and keep a list of the medicines you take.  How can you care for yourself at home?  ?? To prevent dehydration, drink plenty of fluids. Choose water and other caffeine-free clear liquids until you feel better. If you have kidney, heart, or liver disease and have to limit fluids, talk with your doctor before you increase the amount of fluids you drink.  ?? Rest in bed until you feel better.  ?? When you are able to eat, try clear soups, mild foods, and liquids until all symptoms are gone for 12 to 48 hours. Other good choices include dry toast, crackers, cooked cereal, and gelatin dessert, such as Jell-O.  When should you call for help?   Call 911 anytime you think you may need emergency care. For example, call if:  ?? ?? You passed out (lost consciousness).   Call your doctor now or seek immediate medical care if:  ?? ?? You have symptoms of dehydration, such as:  ? Dry eyes and a dry mouth.  ? Passing only a little urine.  ? Feeling thirstier than usual.   ?? ?? You have new or worsening belly pain.   ?? ?? You have a new or higher fever.   ?? ?? You vomit blood or what looks like coffee grounds.   Watch closely for changes in your health, and be sure to contact your doctor if:  ?? ?? You have ongoing nausea and vomiting.   ?? ?? Your vomiting is getting worse.   ?? ?? Your vomiting lasts longer than 2 days.   ?? ?? You are not getting better as expected.   Where can you learn more?  Go to [facility] at https://myuncchart.org  Select Patient Education under [employer]. Enter H591 in the search box to learn more about Nausea and Vomiting: Care Instructions.  Current as of: April 24, 2018??????????????????????????????Content Version: 12.8  ?? 2006-2021 Healthwise, Incorporated.   Care instructions adapted under license by [facility]. If you have questions about a medical condition or this instruction, always ask your healthcare professional. Healthwise, Incorporated disclaims any warranty or liability for your use of this information.

## 2019-07-04 ENCOUNTER — Encounter: Admit: 2019-07-04 | Discharge: 2019-07-05 | Payer: MEDICARE

## 2019-07-04 DIAGNOSIS — F419 Anxiety disorder, unspecified: Principal | ICD-10-CM

## 2019-07-04 DIAGNOSIS — F329 Major depressive disorder, single episode, unspecified: Secondary | ICD-10-CM

## 2019-07-04 DIAGNOSIS — Z79899 Other long term (current) drug therapy: Principal | ICD-10-CM

## 2019-07-04 DIAGNOSIS — F3170 Bipolar disorder, currently in remission, most recent episode unspecified: Principal | ICD-10-CM

## 2019-07-04 DIAGNOSIS — F4310 Post-traumatic stress disorder, unspecified: Principal | ICD-10-CM

## 2019-07-04 NOTE — Unmapped (Signed)
This was a telepsychiatry encounter. I was immediately available via phone/pager, but did not see the patient. I reviewed and discussed the case with the resident, but did not see the patient.  I agree with the assessment and plan as documented in the resident's note.     Caydon Feasel S Malahki Gasaway, MD

## 2019-07-04 NOTE — Unmapped (Signed)
Follow-up instructions:  -- Please continue taking your medications as prescribed for your mental health.   -- Do not make changes to your medications, including taking more or less than prescribed, unless under the supervision of your physician. Be aware that some medications may make you feel worse if abruptly stopped  -- Please refrain from using illicit substances, as these can affect your mood and could cause anxiety or other concerning symptoms.   -- Seek further medical care for any increase in symptoms or new symptoms such as thoughts of wanting to hurt yourself or hurt others.     Contact info:  Life-threatening emergencies: call 911 or go to the nearest ER for medical or psychiatric attention.     Issues that need urgent attention but are not life threatening: call the outpatient clinic at [phone] for assistance.     Non-urgent routine concerns, questions, and refill requests: Please send me a message through MyChart and I will get back to you within 2 business days. Alternatively, leave me a voicemail at [phone] and I will get back to you within 2 business days.     Regarding appointments:  - If you need to cancel your appointment, we ask that you call [phone] at least 24 hours before your scheduled appointment.  - If for any reason you arrive 15 minutes later than your scheduled appointment time, you may not be seen and your visit may be rescheduled.  - Please remember that we will not automatically reschedule missed appointments.  - If you no show, arrive late, or cancel within 24 hours of the scheduled appointment three (3) times with an individual clinic or provider, you can be dismissed from the clinic and will likely be referred to a provider in your community.  - We will do our best to be on time. Sometimes an emergency will arise that might cause your clinician to be late. We will try to inform you of this when you check in for your appointment. If you wait more than 15 minutes past your appointment time without such notice, please speak with the front desk staff.    In the event of bad weather, the clinic staff will attempt to contact you, should your appointment need to be rescheduled. Additionally, you can call the Patient Weather Line [phone]) for system-wide clinic status    For more information and reminders regarding clinic policies (these were provided when you were admitted to the clinic), please ask the front desk.
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
## 2019-07-08 NOTE — Unmapped (Signed)
[facility] Administered Medication Refill Coordination Note      NAME:Ashley Dunlap DOB: [birth year]      Medication: Abilify Maintena  Day Supply: 28 days      SHIPPING      Next delivery from [facility] [phone]) to [facility] for [name] is scheduled for 05/19.    Clinic contact: Jolene Schimke    Patient's next nurse visit for administration: n/a.    We will follow up with clinic monthly for standard refill processing and delivery.      Chioma Mukherjee Samella Parr  Specialty Pharmacy Technician

## 2019-07-09 ENCOUNTER — Encounter: Admit: 2019-07-09 | Discharge: 2019-07-10 | Payer: MEDICARE

## 2019-07-09 DIAGNOSIS — R6889 Other general symptoms and signs: Principal | ICD-10-CM

## 2019-07-09 DIAGNOSIS — D649 Anemia, unspecified: Principal | ICD-10-CM

## 2019-07-09 DIAGNOSIS — N62 Hypertrophy of breast: Principal | ICD-10-CM

## 2019-07-09 DIAGNOSIS — M549 Dorsalgia, unspecified: Principal | ICD-10-CM

## 2019-07-09 NOTE — Unmapped (Signed)
Video Visit Note  This medical encounter was conducted virtually using [email or web address]  [email or web address] protocols.    I have identified myself to the patient and conveyed my credentials to Ms. Janee Morn  Patient/proxy has provided verbal consent to proceed with this video visit.  In case we get disconnected, [phone] (home) [phone] (work)  In case of Emergency, patient's current location: Home: [address]  [place] [address]  Is there someone else in the room? No.     Ms. Delisa is a 29 y.o. female that presents for a video visit today regarding the following issues:    Assessment/Plan:      Problem List Items Addressed This Visit     None      Visit Diagnoses     Normocytic anemia    -  Primary  Likely IDA along with anemia of chronic disease. Consider other nutritional deficiencies given bariatric surgery. Will get a CBC. ferritin, B12 and folate today.  Had normal thyroid in December and so will not repeat a TSH.    Relevant Orders    CBC w/ Differential    Ferritin  Iron and TIBC  Folate  B12    Vitamin D 25 Hydroxy (25OH D2 + D3)    Cold intolerance        Relevant Orders    Vitamin D 25 Hydroxy (25OH D2 + D3)    Large breasts      Referral as per patient's request.    Relevant Orders    Plastic Surgery    Back pain, unspecified back location, unspecified back pain laterality, unspecified chronicity        Relevant Orders    Plastic Surgery          Followup:  No follow-ups on file.        I spent 6 minutes on the real-time audio and video with the patient on the date of service. I spent an additional 5 minutes on pre- and post-visit activities on the date of service.     The patient was physically located in [place] or a state in which I am permitted to provide care. The patient and/or parent/guardian understood that s/he may incur co-pays and cost sharing, and agreed to the telemedicine visit. The visit was reasonable and appropriate under the circumstances given the patient's presentation at the time. The patient and/or parent/guardian has been advised of the potential risks and limitations of this mode of treatment (including, but not limited to, the absence of in-person examination) and has agreed to be treated using telemedicine. The patient's/patient's family's questions regarding telemedicine have been answered.     If the visit was completed in an ambulatory setting, the patient and/or parent/guardian has also been advised to contact their provider???s office for worsening conditions, and seek emergency medical treatment and/or call 911 if the patient deems either necessary.           Subjective:     HPI  Ms. Bandel is a 29 y.o. female requesting a video visit to discuss the following issues:    Thinks iron is low because she is cold all the time x months. Bodyaches x months. Not feeling dizzy or lightheaded or SOB. No diarrhrea or blood in stool or bleeding gums. No n/v. Mood described as okay. Weight loss 2/2 bariatric surgery.    ROS  As per HPI.       HISTORY  I have reviewed the patient's problem list, current medications, and allergies and have updated/reconciled  them as needed.    Ms. Gallaway  reports that she quit smoking about 3 years ago. Her smoking use included cigarettes. She has a 0.50 pack-year smoking history. She has never used smokeless tobacco.       Objective:     General: Well appearing , in no acute distress  Skin: Color is normal. No rashes.  Psych: Appropriate affect, normal mood  Resp: Normal work of breathing, no retractions

## 2019-07-09 NOTE — Unmapped (Signed)
Patient Education        Anemia: Care Instructions  Your Care Instructions     Anemia is a low level of red blood cells, which carry oxygen throughout your body. Many things can cause anemia. Lack of iron is one of the most common causes. Your body needs iron to make hemoglobin, a substance in red blood cells that carries oxygen from the lungs to your body's cells. Without enough iron, the body produces fewer and smaller red blood cells. As a result, your body's cells do not get enough oxygen, and you feel tired and weak. And you may have trouble concentrating.  Bleeding is the most common cause of a lack of iron. You may have heavy menstrual bleeding or bleeding caused by conditions such as ulcers, hemorrhoids, or cancer. Regular use of aspirin or other anti-inflammatory medicines (such as ibuprofen) also can cause bleeding in some people. A lack of iron in your diet also can cause anemia, especially at times when the body needs more iron, such as during pregnancy, infancy, and the teen years.  Your doctor may have prescribed iron pills. It may take several months of treatment for your iron levels to return to normal. Your doctor also may suggest that you eat foods that are rich in iron, such as meat and beans.  There are many other causes of anemia. It is not always due to a lack of iron. Finding the specific cause of your anemia will help your doctor find the right treatment for you.  Follow-up care is a key part of your treatment and safety. Be sure to make and go to all appointments, and call your doctor if you are having problems. It's also a good idea to know your test results and keep a list of the medicines you take.  How can you care for yourself at home?  ?? Take your medicines exactly as prescribed. Call your doctor if you think you are having a problem with your medicine.  ?? If your doctor recommends iron pills, take them as directed:  ? Try to take the pills on an empty stomach about 1 hour before or 2 hours after meals. But you may need to take iron with food to avoid an upset stomach.  ? Do not take antacids or drink milk or caffeine drinks (such as coffee, tea, or cola) at the same time or within 2 hours of the time that you take your iron. They can make it hard for your body to absorb the iron.  ? Vitamin C (from food or supplements) helps your body absorb iron. Try taking iron pills with a glass of orange juice or some other food that is high in vitamin C, such as citrus fruits.  ? Iron pills may cause stomach problems, such as heartburn, nausea, diarrhea, constipation, and cramps. Be sure to drink plenty of fluids, and include fruits, vegetables, and fiber in your diet each day. Iron pills often make your bowel movements dark or green.  ? If you forget to take an iron pill, do not take a double dose of iron the next time you take a pill.  ? Keep iron pills out of the reach of small children. An overdose of iron can be very dangerous.  ?? Follow your doctor's advice about eating iron-rich foods. These include red meat, shellfish, poultry, eggs, beans, raisins, whole-grain bread, and leafy green vegetables.  ?? Steam vegetables to help them keep their iron content.  When should you call  for help?   Call 911 anytime you think you may need emergency care. For example, call if:  ?? ?? You have symptoms of a heart attack. These may include:  ? Chest pain or pressure, or a strange feeling in the chest.  ? Sweating.  ? Shortness of breath.  ? Nausea or vomiting.  ? Pain, pressure, or a strange feeling in the back, neck, jaw, or upper belly or in one or both shoulders or arms.  ? Lightheadedness or sudden weakness.  ? A fast or irregular heartbeat.  After you call 911, the operator may tell you to chew 1 adult-strength or 2 to 4 low-dose aspirin. Wait for an ambulance. Do not try to drive yourself.   ?? ?? You passed out (lost consciousness).   Call your doctor now or seek immediate medical care if:  ?? ?? You have new or increased shortness of breath.   ?? ?? You are dizzy or lightheaded, or you feel like you may faint.   ?? ?? Your fatigue and weakness continue or get worse.   ?? ?? You have any abnormal bleeding, such as:  ? Nosebleeds.  ? Vaginal bleeding that is different (heavier, more frequent, at a different time of the month) than what you are used to.  ? Bloody or black stools, or rectal bleeding.  ? Bloody or pink urine.   Watch closely for changes in your health, and be sure to contact your doctor if:  ?? ?? You do not get better as expected.   Where can you learn more?  Go to [facility] at https://myuncchart.org  Select Patient Education under [employer]. Enter R301 in the search box to learn more about Anemia: Care Instructions.  Current as of: November 20, 2018??????????????????????????????Content Version: 12.8  ?? 2006-2021 Healthwise, Inc[employer].   Care instructions adapted under license by [facility]. If you have questions about a medical condition or this instruction, always ask your healthcare professional. Healthwise, Incorporated disclaims any warranty or liability for your use of this information.

## 2019-07-10 NOTE — Unmapped (Signed)
Good afternoon,     Patient called in to request a doctors note for her video visit.

## 2019-07-16 MED FILL — ABILIFY MAINTENA 400 MG INTRAMUSCULAR SUSPENSION,EXTENDED RELEASE: 28 days supply | Qty: 1 | Fill #4 | Status: AC

## 2019-07-16 MED FILL — ABILIFY MAINTENA 400 MG INTRAMUSCULAR SUSPENSION,EXTENDED RELEASE: INTRAMUSCULAR | 28 days supply | Qty: 1 | Fill #4

## 2019-07-22 ENCOUNTER — Encounter
Admit: 2019-07-22 | Discharge: 2019-07-23 | Payer: MEDICARE | Attending: Student in an Organized Health Care Education/Training Program | Primary: Student in an Organized Health Care Education/Training Program

## 2019-07-22 MED ORDER — ABILIFY MAINTENA 400 MG INTRAMUSCULAR SUSPENSION,EXTENDED RELEASE
INTRAMUSCULAR | 0 refills | 672 days | Status: CN
Start: 2019-07-22 — End: 2021-04-27

## 2019-07-22 MED ORDER — TRANEXAMIC ACID 650 MG TABLET
ORAL_TABLET | Freq: Three times a day (TID) | ORAL | 0 refills | 10 days | Status: CP
Start: 2019-07-22 — End: ?

## 2019-07-22 NOTE — Unmapped (Signed)
[facility]  Established Patient Clinic Note    ASSESSMENT/PLAN:  Problem List Items Addressed This Visit     None          Follow-up: ***    Attending: ***     SUBJECTIVE:  No chief complaint on file.      HPI:  Ashley Dunlap is a 29 y.o. female who presents to clinic today for an established patient visit.    ***: ***     I have reviewed the patients problem list, medical, family, and social histories, current medications, and allergies and updated them as needed.    ROS:  -As above in HPI and the Assessment and Plan.    Health Maintenance:   Health Maintenance   Topic Date Due   ??? COVID-19 Vaccine (1) Never done   ??? Hemoglobin A1c  07/29/2019   ??? Foot Exam  11/21/2019   ??? Urine Albumin/Creatinine Ratio  11/21/2019   ??? Retinal Eye Exam  12/12/2019   ??? Serum Creatinine Monitoring  04/15/2020   ??? Potassium Monitoring  04/15/2020   ??? Pap Smear with Reflex HPV (21-29)  09/11/2020   ??? DTaP/Tdap/Td Vaccines (8 - Td or Tdap) 10/26/2025   ??? Hepatitis C Screen  Completed   ??? Influenza Vaccine  Completed   ??? Pneumococcal Vaccine  Completed       OBJECTIVE:  Physical Exam:  Vitals: There were no vitals filed for this visit. Wt:   Wt Readings from Last 3 Encounters:   07/09/19 (!) 110.7 kg (244 lb)   05/02/19 (!) 116.4 kg (256 lb 9.6 oz)   05/02/19 (!) 116.1 kg (256 lb)     Constitutional: Well-developed, well-nourished, and in no distress.  HENT:   Head: Normocephalic and atraumatic.   Right Ear: External ear normal.   Left Ear: External ear normal.   Nose: Face mask in place.  Mouth: Face mask in place.  Eyes: Right eye exhibits no discharge. Left eye exhibits no discharge. No scleral icterus.   Neck: Neck supple.   Cardiovascular: Normal rate, regular rhythm without any murmurs, rubs or gallops.   Pulmonary/Chest: Effort normal. No stridor. No respiratory distress.  Lungs clear to auscultation bilaterally.  Abdominal: No abdominal distension.   Musculoskeletal: No obvious deformities. Skin: Skin is warm and dry, not diaphoretic.   Psychiatric: Affect normal.  Vitals reviewed.    Zachery Dauer, MD  Resident Physician, PGY-3  Department of Family Medicine  [facility]  Pager ID: [phone]    [facility] [place] [facility]  [address], [place], [place] [address] ??? Telephone [phone] ??? Fax [phone]  [email or web address] Telephone [phone] ??? Fax [phone]  [email or web address]

## 2019-07-28 NOTE — Unmapped (Signed)
Patient reports a history of syncopal episodes as documented below that are chronic.  She is currently following with [facility] cardiology for evaluation.  On review of her chart, she has had a notable anemia that is stable, normal CMP and normal D-dimer in the past as well as stable EKG, chest x-ray and negative troponins as well.  The etiology of her syncope is unclear at this time but could be secondary to some cardiac component.  -Wrote patient a letter today regarding her need to live near a hospital.

## 2019-07-28 NOTE — Unmapped (Signed)
Patient does not appear to be manic and bipolar affective disorder appears to be stable at this time.  -She currently follows with psychiatry and gets injectable Abilify every 28 days.

## 2019-07-31 NOTE — Unmapped (Signed)
I saw and evaluated the patient, participating in the key portions of the service.  I reviewed the resident???s note.  I agree with the resident???s findings and plan. Soyla Murphy, MD

## 2019-08-01 ENCOUNTER — Encounter: Admit: 2019-08-01 | Discharge: 2019-08-02 | Payer: MEDICARE

## 2019-08-01 ENCOUNTER — Ambulatory Visit: Admit: 2019-08-01 | Discharge: 2019-08-02 | Payer: MEDICARE

## 2019-08-01 MED ADMIN — gadobenate dimeglumine (MULTIHANCE) 529 mg/mL (0.1mmol/0.2mL) solution 10 mL: 10 mL | INTRAVENOUS | @ 23:00:00 | Stop: 2019-08-01

## 2019-08-03 DIAGNOSIS — R160 Hepatomegaly, not elsewhere classified: Principal | ICD-10-CM

## 2019-08-05 NOTE — Unmapped (Signed)
06/08-per Ashley Dunlap she would like to take patient  clinic list ,I advised her we would leave her enrolled ,and follow-up in 2 months-CB

## 2019-08-14 ENCOUNTER — Encounter: Admit: 2019-08-14 | Discharge: 2019-08-15 | Payer: MEDICARE
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
## 2019-08-15 DIAGNOSIS — D134 Benign neoplasm of liver: Secondary | ICD-10-CM | POA: Insufficient documentation

## 2019-08-15 MED ORDER — METRONIDAZOLE 500 MG TABLET
ORAL_TABLET | Freq: Two times a day (BID) | ORAL | 0 refills | 7 days | Status: CP
Start: 2019-08-15 — End: 2019-08-22

## 2019-08-17 MED ORDER — MEDROXYPROGESTERONE 10 MG TABLET
0 days
Start: 2019-08-17 — End: ?

## 2019-09-17 ENCOUNTER — Ambulatory Visit: Admit: 2019-09-17 | Discharge: 2019-09-18 | Payer: MEDICARE

## 2019-09-17 ENCOUNTER — Encounter: Admit: 2019-09-17 | Discharge: 2019-09-18 | Payer: MEDICARE
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
## 2019-09-17 NOTE — Unmapped (Signed)
Knee Exercises  Your Care Instructions  Here are some examples of exercises for your knee. Start each exercise slowly. Ease off the exercise if you start to have pain.  Your doctor or physical therapist will tell you when you can start these exercises and which ones will work best for you.  How to do the exercises  Quad sets    1. Sit with your affected leg straight and supported on the floor or a firm bed. Place a small, rolled-up towel under your knee. Your other leg should be bent, with that foot flat on the floor.  2. Tighten the thigh muscles of your affected leg by pressing the back of your knee down into the towel.  3. Hold for about 6 seconds, then rest for up to 10 seconds.  4. Repeat 8 to 12 times.  5. Switch legs and repeat steps 1 through 4, even if only one knee is sore.  Straight-leg raises to the front    1. Lie on your back with your good knee bent so that your foot rests flat on the floor. Your affected leg should be straight. Make sure that your low back has a normal curve. You should be able to slip your hand in between the floor and the small of your back, with your palm touching the floor and your back touching the back of your hand.  2. Tighten the thigh muscles in your affected leg by pressing the back of your knee flat down to the floor. Hold your knee straight.  3. Keeping the thigh muscles tight and your leg straight, lift your affected leg up so that your heel is about 12 inches off the floor. Hold for about 6 seconds, then lower slowly.  4. Relax for up to 10 seconds between repetitions.  5. Repeat 8 to 12 times.  6. Switch legs and repeat steps 1 through 5, even if only one knee is sore.    Quadriceps stretch (facedown)    1. Lie flat on your stomach, and rest your face on the floor.  2. Wrap a towel or belt strap around the lower part of your affected leg. Then use the towel or belt strap to slowly pull your heel toward your buttock until you feel a stretch.  3. Hold for about 15 to 30 seconds, then relax your leg against the towel or belt strap.  4. Repeat 2 to 4 times.  5. Switch legs and repeat steps 1 through 4, even if only one knee is sore.  Stationary exercise bike    If you do not have a stationary exercise bike at home, you can find one to ride at your local health club or community center.  1. Adjust the height of the bike seat so that your knee is slightly bent when your leg is extended downward. If your knee hurts when the pedal reaches the top, you can raise the seat so that your knee does not bend as much.  2. Start slowly. At first, try to do 5 to 10 minutes of cycling with little to no resistance. Then increase your time and the resistance bit by bit until you can do 20 to 30 minutes without pain.  3. If you start to have pain, rest your knee until your pain gets back to the level that is normal for you. Or cycle for less time or with less effort.  Follow-up care is a key part of your treatment and safety. Be sure to  make and go to all appointments, and call your doctor if you are having problems. It's also a good idea to know your test results and keep a list of the medicines you take.    Ankle Exercises  Your Care Instructions  Here are some examples of typical rehabilitation exercises for your condition. Start each exercise slowly. Ease off the exercise if you start to have pain.  Your doctor or physical therapist will tell you when you can start these exercises and which ones will work best for you.  Range of Motion    1. Trace the alphabet with your toe. This helps your ankle move in all directions.  Calf wall stretch (back knee straight)    1. Stand facing a wall with your hands on the wall at about eye level. Put your affected leg about a step behind your other leg.  2. Keeping your back leg straight and your back heel on the floor, bend your front knee and gently bring your hip and chest toward the wall until you feel a stretch in the calf of your back leg.  3. Hold the stretch for at least 15 to 30 seconds.  4. Repeat 2 to 4 times.  Calf wall stretch (knees bent)    1. Stand facing a wall with your hands on the wall at about eye level. Put your affected leg about a step behind your other leg.  2. Keeping both heels on the floor, bend both knees. Then gently bring your hip and chest toward the wall until you feel a stretch in the calf of your back leg.  3. Hold the stretch for at least 15 to 30 seconds.  4. Repeat 2 to 4 times.  Resisted ankle inversion    1. Sit on the floor with your good leg crossed over your other leg.  2. Hold both ends of an exercise band and loop the band around the inside of your affected foot. Then press your other foot against the band.  3. Keeping your legs crossed, slowly push your affected foot against the band so that foot moves away from your other foot. Then slowly relax.  4. Repeat 8 to 12 times.  Resisted ankle eversion    1. Sit on the floor with your legs straight.  2. Hold both ends of an exercise band and loop the band around the outside of your affected foot. Then press your other foot against the band.  3. Keeping your leg straight, slowly push your affected foot outward against the band and away from your other foot without letting your leg rotate. Then slowly relax.  4. Repeat 8 to 12 times.  Resisted ankle plantarflexion        1. Sit on the floor with your affected leg straight.  2. Hold both ends of an exercise band and loop the band around the ball of your affected foot. Then press your other foot against the band.  3. Keeping your leg straight, slowly push your affected foot downward against the band without letting your leg rotate. Then slowly relax.  4. Repeat 8 to 12 times.

## 2019-09-22 DIAGNOSIS — S8991XA Unspecified injury of right lower leg, initial encounter: Principal | ICD-10-CM

## 2019-09-22 NOTE — Unmapped (Signed)
Patient called today with worsening knee pain not relieved with Tylenol or ice. Patient c/o stiffness, buckling when walking and inability to straighten her leg. She has been taking Tylenol and apply ice and elevating the knee without improvement. An order was placed for MRI of right knee.

## 2019-09-24 ENCOUNTER — Ambulatory Visit: Admit: 2019-09-24 | Discharge: 2019-09-25 | Payer: MEDICARE | Attending: Family Medicine | Primary: Family Medicine

## 2019-09-24 MED ADMIN — ketorolac (TORADOL) injection 30 mg: 30 mg | INTRAMUSCULAR | @ 17:00:00 | Stop: 2019-09-24

## 2019-09-24 NOTE — Unmapped (Signed)
[facility]- [employer]  Established Patient Clinic Note    Assessment/Plan:     Problem List Items Addressed This Visit        Musculoskeletal and Integument    Sprain of right ankle - Primary - Acute pain of right knee     - 11 days out from fall with xrays negative for fracture followed by [facility]. MRI scheduled for further eval of her knee. Here today with continued pain despite scheduled tylenol, voltaren gel and ice. 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   - Will give toradol shot today for antiinflammatory properties and pain relief, continue scheduled tylenol and ice. Recommend restarting voltaren gel.  - Will also refer to formal physical therapy  - Note written for more frequent breaks at work         Relevant Medications    ketorolac (TORADOL) injection 30 mg (Completed) (Start on 09/24/2019  2:00 PM)    Other Relevant Orders    AMB REFERRAL TO PHYSICAL THERAPY       Subjective   Ashley Dunlap is a 29 y.o. female  coming to clinic today for the following issues:    Chief Complaint   Patient presents with   ??? Ankle Pain     Rt ankle     HPI:    #Pain: Recent fall where she hurt her ankle and knee. Has had negative xrays, MRI scheduled for two weeks from now. Ambulating with crutches. Currently taking tylenol which is not helping. States that she stands 8 hours a day at work and in pain. She has been icing, using topical creams. Taking 4 extra strength tylenol every 6 hours. Aching and throbbing pain. Has had surgery on this ankle in the past. Using voltaren gel. Fell 1 days ago. Standing and walking hurt the worse. Took 4 days off last week. Cant take off anymore. No option for light duty.     ROS as above in HPI, otherwise remaining ROS negative.    I have reviewed the problem list, medications, and allergies and have updated/reconciled them if needed.    Ashley Dunlap  reports that she quit smoking about 3 years ago. Her smoking use included cigarettes. She has a 0.50 pack-year smoking history. She has never used smokeless tobacco.  Health Maintenance   Topic Date Due   ??? COVID-19 Vaccine (1) Never done   ??? Hemoglobin A1c  07/29/2019   ??? Influenza Vaccine (1) 10/29/2019   ??? Foot Exam  11/21/2019   ??? Urine Albumin/Creatinine Ratio  11/21/2019   ??? Retinal Eye Exam  12/12/2019   ??? Serum Creatinine Monitoring  07/10/2020   ??? Potassium Monitoring  07/10/2020   ??? Pap Smear with Reflex HPV (21-29)  09/11/2020   ??? DTaP/Tdap/Td Vaccines (8 - Td or Tdap) 10/26/2025   ??? Hepatitis C Screen  Completed   ??? Pneumococcal Vaccine  Completed       Objective     VITALS: BP 101/70 (BP Site: R Arm, BP Position: Sitting, BP Cuff Size: Large)  - Pulse 61  - Temp 35.9 ??C (96.7 ??F) (Temporal)  - Wt 97.6 kg (215 lb 3.2 oz)  - LMP 08/28/2019 (Approximate)  - BMI 31.78 kg/m??     Physical Exam  Vitals reviewed.   Constitutional:       General: She is not in acute distress.     Appearance: Normal appearance.  HENT:      Head: Normocephalic and atraumatic.   Eyes:      Conjunctiva/sclera: Conjunctivae normal.   Cardiovascular:      Rate and Rhythm: Normal rate.   Pulmonary:      Effort: Pulmonary effort is normal.   Musculoskeletal:      Comments: Ambulates with crutches, knee brace and walking boot in place   Neurological:      General: No focal deficit present.      Mental Status: She is alert and oriented to person, place, and time.   Psychiatric:         Mood and Affect: Mood normal.         Behavior: Behavior normal.         Thought Content: Thought content normal.         Judgment: Judgment normal.       LABS/IMAGING  I have reviewed pertinent recent labs and imaging in Epic    [facility]  [place] [facility] [place] [facility]  [address] [place], [place] [address] ??? Telephone [phone] ??? Fax [phone]  [email or web address]

## 2019-09-24 NOTE — Unmapped (Signed)
-   11 days out from fall with xrays negative for fracture followed by [facility]. MRI scheduled for further eval of her knee. Here today with continued pain despite scheduled tylenol, voltaren gel and ice. 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   - Will give toradol shot today for antiinflammatory properties and pain relief, continue scheduled tylenol and ice. Recommend restarting voltaren gel.  - Will also refer to formal physical therapy  - Note written for more frequent breaks at work

## 2019-09-25 NOTE — Unmapped (Signed)
A user error has taken place: encounter opened in error, closed for administrative reasons.

## 2019-10-02 ENCOUNTER — Ambulatory Visit: Admit: 2019-10-02 | Payer: MEDICARE

## 2019-10-02 NOTE — Unmapped (Signed)
Per Nurse in clinic a dose is not needed at this following up in 2 weeks.

## 2019-10-03 NOTE — Unmapped (Signed)
Pt was initially contacted to reschedule her appointment today following a report of N/V/D (+travel/COVID screening). At the time of the call the patient reported having significant bleeding, and the call was transferred to this RN. Pt reports ongoing heavy bleeding for about a week and reports soaking an overnight pad an hour x2 + today with large clots. When asked, pt states she feels lightheaded. Instructed patient to go to the emergency room. 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 Pt expressed understanding and denied further questions.

## 2019-10-04 ENCOUNTER — Encounter: Admit: 2019-10-04 | Discharge: 2019-10-04 | Disposition: A | Discharge status: Home / Self Care | Payer: MEDICARE

## 2019-10-04 DIAGNOSIS — N939 Abnormal uterine and vaginal bleeding, unspecified: Principal | ICD-10-CM

## 2019-10-04 DIAGNOSIS — N390 Urinary tract infection, site not specified: Principal | ICD-10-CM

## 2019-10-04 LAB — CBC W/ AUTO DIFF
BASOPHILS ABSOLUTE COUNT: 0 10*9/L (ref 0.0–0.1)
BASOPHILS RELATIVE PERCENT: 0.8 %
EOSINOPHILS ABSOLUTE COUNT: 0.1 10*9/L (ref 0.0–0.4)
EOSINOPHILS RELATIVE PERCENT: 2 %
HEMATOCRIT: 35.1 % — ABNORMAL LOW (ref 36.0–46.0)
LARGE UNSTAINED CELLS: 2 % (ref 0–4)
LYMPHOCYTES ABSOLUTE COUNT: 1.5 10*9/L (ref 1.5–5.0)
MEAN CORPUSCULAR HEMOGLOBIN CONC: 32.2 g/dL (ref 31.0–37.0)
MEAN CORPUSCULAR HEMOGLOBIN: 31.4 pg (ref 26.0–34.0)
MEAN CORPUSCULAR VOLUME: 97.3 fL (ref 80.0–100.0)
MEAN PLATELET VOLUME: 10.9 fL — ABNORMAL HIGH (ref 7.0–10.0)
MONOCYTES ABSOLUTE COUNT: 0.2 10*9/L (ref 0.2–0.8)
MONOCYTES RELATIVE PERCENT: 4.8 %
NEUTROPHILS ABSOLUTE COUNT: 1.7 10*9/L — ABNORMAL LOW (ref 2.0–7.5)
NEUTROPHILS RELATIVE PERCENT: 47.5 %
PLATELET COUNT: 279 10*9/L (ref 150–440)
RED BLOOD CELL COUNT: 3.61 10*12/L — ABNORMAL LOW (ref 4.00–5.20)
RED CELL DISTRIBUTION WIDTH: 13.5 % (ref 12.0–15.0)
WBC ADJUSTED: 3.5 10*9/L — ABNORMAL LOW (ref 4.5–11.0)

## 2019-10-04 LAB — COMPREHENSIVE METABOLIC PANEL
ALBUMIN: 3.7 g/dL (ref 3.4–5.0)
ALKALINE PHOSPHATASE: 46 U/L (ref 46–116)
ALT (SGPT): 32 U/L (ref 10–49)
ANION GAP: 5 mmol/L (ref 5–14)
AST (SGOT): 21 U/L (ref <=34)
BILIRUBIN TOTAL: 0.7 mg/dL (ref 0.3–1.2)
BUN / CREAT RATIO: 10
CALCIUM: 9 mg/dL (ref 8.7–10.4)
CHLORIDE: 107 mmol/L (ref 98–107)
CO2: 27 mmol/L (ref 20.0–31.0)
CREATININE: 0.77 mg/dL
EGFR CKD-EPI AA FEMALE: >90 mL/min/{1.73_m2} (ref >=60)
EGFR CKD-EPI NON-AA FEMALE: >90 mL/min/{1.73_m2} (ref >=60)
GLUCOSE RANDOM: 81 mg/dL (ref 70–179)
POTASSIUM: 3.6 mmol/L (ref 3.4–4.5)

## 2019-10-04 LAB — URINALYSIS WITH CULTURE REFLEX
BILIRUBIN UA: NEGATIVE
GLUCOSE UA: NEGATIVE
KETONES UA: NEGATIVE
NITRITE UA: NEGATIVE
PH UA: 5 (ref 5.0–9.0)
PROTEIN UA: 30 — AB
RBC UA: >182 /HPF — ABNORMAL HIGH (ref <=4)
SPECIFIC GRAVITY UA: 1.03 (ref 1.003–1.030)
SQUAMOUS EPITHELIAL: <1 /HPF (ref 0–5)
UROBILINOGEN UA: 0.2
WBC UA: 12 /HPF — ABNORMAL HIGH (ref 0–5)

## 2019-10-04 LAB — MONOCYTES ABSOLUTE COUNT: Monocytes:NCnc:Pt:Bld:Qn:Automated count: 0.2

## 2019-10-04 LAB — PREGNANCY TEST URINE: Choriogonadotropin (pregnancy test):PrThr:Pt:Urine:Ord:: NEGATIVE

## 2019-10-04 LAB — BILIRUBIN TOTAL: Bilirubin:MCnc:Pt:Ser/Plas:Qn:: 0.7

## 2019-10-04 LAB — RBC UA: Erythrocytes:Naric:Pt:Urine sed:Qn:Microscopy.light.HPF: >182 — ABNORMAL HIGH

## 2019-10-04 MED ORDER — NITROFURANTOIN MONOHYDRATE/MACROCRYSTALS 100 MG CAPSULE
ORAL_CAPSULE | Freq: Two times a day (BID) | ORAL | 0 refills | 7.00000 days | Status: CP
Start: 2019-10-04 — End: 2019-10-11
  Filled 2019-10-04: qty 14, 7d supply, fill #0

## 2019-10-04 MED ADMIN — MORPhine 4 mg/mL injection 4 mg: 4 mg | INTRAMUSCULAR | @ 15:00:00 | Stop: 2019-10-04

## 2019-10-04 MED FILL — NITROFURANTOIN MONOHYDRATE/MACROCRYSTALS 100 MG CAPSULE: 7 days supply | Qty: 14 | Fill #0 | Status: AC

## 2019-10-04 NOTE — Unmapped (Signed)
Heavy Vaginal bleeding X4 weeks , abdominal pain, headache

## 2019-10-04 NOTE — Unmapped (Signed)
[facility]  Emergency Department Provider Note       ED Clinical Impression     Final diagnoses:   UTI (urinary tract infection), uncomplicated (Primary)   Vaginal bleeding        Impression, ED Course, Assessment and Plan   Impression: Ashley Dunlap is a 29 y.o. female with an extensive past medical history who presents today with worsening vaginal bleeding over the last month.    Patient presented at [facility] yesterday with similar symptoms, however stated that the wait was too long so she decided to leave without getting a work-up.  States that the symptoms have remained the same and she would like to be evaluated.    Her vitals are reassuring with a heart rate of 64, normal blood pressure.  Patient is afebrile and saturating well on room air with no signs of respiratory distress.  On exam, patient is albinism with baseline horizontal nystagmus and is partially blind.  She has mild suprapubic tenderness to palpation with no rebound, guarding or peritoneal signs. Otherwise her exam is benign.    At this time, I am not concerned with ominous etiologies such as ovarian torsion, threatened abortion, ectopic pregnancy given her benign exam and continued of symptoms over the last month.  Patient is closely followed by her OB/GYN and primary care doctor. Recently seen by her PCP who renewed her transischemic acid for this continuous vaginal bleeding. Patient has been worked up for vaginal bleeding at both her OB/GYN and her primary care providers. Patient's PCP also placed an OB/GYN referral 2 days ago at [facility].    Plan to obtain CBC, CMP as well as UA with pregnancy.  Patient would also like some pain control.  She took Tylenol approximately 3 hours ago.  Has tolerated morphine in the past.  We will give her low-dose morphine and reevaluate. If patient's lab work is stable and she remains hemodynamically stable, I anticipate discharge with OB/GYN and PCP follow-up.      ED Course as of Oct 04 1202   Sat Oct 04, 2019   1003 Patient's hemoglobin at 11.3, this is her baseline.  No concern for anemia at this time.      1057 CBC and CMP all within normal limits. Patient's urine showing UTI.  Patient's only symptom suprapubic tenderness.  Patient is a diabetic.  Plan to treat with outpatient nitrofurantoin.      1203 Patient is feeling better.  We discussed her need for follow-up with her primary care provider and OB/GYN.  Patient understood and agreed.  Vital signs remain stable. Will discharge home.              ____________________________________________    The case was discussed with Dr. Allie Bossier, MD who is in agreement with the above assessment and plan    Dictation software was used while making this note. Please excuse any errors made with dictation software.    Additional Medical Decision Making     I have reviewed the vital signs and the nursing notes. Labs and radiology results that were available during my care of the patient were independently reviewed by me and considered in my medical decision making.     I independently visualized the EKG tracing if performed  I independently visualized the radiology images if performed  I reviewed the patient's prior medical records if available.  Additional history obtained from family if available  I discussed the case with the admitting provider and the  consulting services if the patient was admitted and/or consulting services were utilized.     History   Chief Complaint  Chief Complaint   Patient presents with   ??? Vaginal Bleeding       HPI   Ashley Dunlap is a 29 y.o. 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  Patient states that her vaginal bleeding started approximately 1 month ago, she is saturating through her pads approximately every 1-2 hours with some blood clots noted.  Her menstrual cycles are typically regular, occurring every month and lasting approximately 7 days.  She is sexually active with one partner, has a Nexplanon in place.  The Nexplanon was placed by her primary care doctor on 07/22/2019 by her primary care provider.  At this time her provider was aware of her abnormal uterine bleeding and refilled her tranexamic acid tablets.  Patient presented today to the ED because her bleeding has gotten worse and she started to pass large clots.  She also feels like she is lightheaded and is having recurrent headaches.  Reportedly experienced a syncopal episode approximately 2 days ago, but this is not new for her.  Patient diagnosed with bacterial vaginitis approximately 1 month ago and given Flagyl which she completed. Family history significant for endometriosis.    Additionally patient does not endorse any fevers or chills, changes in vision, difficulty breathing or swallowing, chest pain or shortness of breath, abdominal pain, nausea vomiting or diarrhea, changes in bowel or bladder, blood in urine or stool, difficulty with gait or balance, focal numbness tingling or weakness.  Patient also denies any abnormal vaginal discharge or foul smell from her vagina.  Patient has not been having intercourse since of bleeding.    REVIEW OF SYSTEMS: 12 point review of systems was performed and is negative other than positive elements noted in HPI   General/Constitutional: Negative for fever.  HEENT:  Negative for vision changes.  Cardiovascular: Negative for chest pain.  Respiratory: Negative for shortness of breath.  Gastrointestinal: Negative for vomiting, or diarrhea.  Genitourinary: Negative for dysuria.  Musculoskeletal: Negative for back pain.  Integumentary: Negative for rash.  Neurologic: Negative for focal weakness or numbness.  Psychiatric: Negative for hallucinations.  Hematologic: Negative for easy bruising or petechiae.  Allergy: Negative for hives      Past Medical History:   Diagnosis Date   ??? 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 Anemia    ??? Anxiety    ??? Arthritis    ??? Depression    ??? Diabetes mellitus (CMS-HCC)    ??? Diabetes mellitus (CMS-HCC)     type 2, uncomplicate   ??? Endometriosis    ??? Female infertility    ??? GERD (gastroesophageal reflux disease)    ??? Lack of access to transportation    ??? Migraine    ??? Morbid obesity with BMI of 50.0-59.9, adult (CMS-HCC)    ??? Peripheral polyneuropathy    ??? 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 Sickle cell trait (CMS-HCC)    ??? Strain of right pectoralis muscle    ??? Visual impairment    ??? Vitamin B12 deficiency        Past Surgical History:   Procedure Laterality Date   ???  ANKLE SURGERY Right 02/2017   ??? EYE SURGERY     ??? KNEE SURGERY     ??? PR LAP GASTRIC BYPASS/ROUX-EN-Y Midline 10/31/2018    Procedure: (R26)LAPAROSCOPY, SURG, GASTRIC RESTRICT PROC; W/GASTRIC BYPASS & ROUX-EN-Y GASTROENTEROS(ROUX LIMB 150 CM/LESS);  Surgeon: Felton Clinton, MD;  Location: MAIN OR [facility];  Service: Gastrointestinal   ??? PR UPPER GI ENDOSCOPY,BIOPSY N/A 03/06/2018    Procedure: UGI ENDOSCOPY; WITH BIOPSY, SINGLE OR MULTIPLE;  Surgeon: Dara Lords, MD;  [facility];  Service: Gastroenterology   ??? PR UPPER GI ENDOSCOPY,BIOPSY N/A 12/02/2018    Procedure: UGI ENDOSCOPY; WITH BIOPSY, SINGLE OR MULTIPLE;  Surgeon: Thurmon Fair, MD;  Location: [facility] PROCEDURES [facility];  Service: Gastroenterology   ??? PR UPPER GI ENDOSCOPY,BIOPSY N/A 05/02/2019    Procedure: UGI ENDOSCOPY; WITH BIOPSY, SINGLE OR MULTIPLE;  Surgeon: Leland Her, MD;  Location: [facility] PROCEDURES [facility];  Service: Gastroenterology   ??? STRABISMUS SURGERY           Current Facility-Administered Medications:   ???  ARIPiprazole (ABILIFY MAINTENA) injection 400 mg, 400 mg, Intramuscular, Q28 Days, Beryle Lathe, MD, 400 mg at 05/02/19 1159    Current Outpatient Medications:   ???  ARIPiprazole (ABILIFY MAINTENA) 400 mg SERR injection, Inject 2 mL (400 mg total) into the muscle every twenty-eight (28) days for 24 doses., Disp: 24 each, Rfl: 0  ???  bisacodyL (DULCOLAX) 10 mg suppository, bisacodyl 10 mg rectal suppository, Disp: , Rfl:   ???  blood-glucose meter kit, Disp. blood glucose meter kit preferred by patient's insurance. Check blood sugars as directed by provider. Dx: Diabetes, E11.9, Disp: 1 each, Rfl: 11  ???  diclofenac sodium (VOLTAREN) 1 % gel, Apply 2 g topically Four (4) times a day., Disp: 100 g, Rfl: 3  ???  FLUoxetine (PROZAC) 20 mg/5 mL (4 mg/mL) solution, Take 10 mL (40 mg total) by mouth daily., Disp: 300 mL, Rfl: 2  ???  lancets 31 gauge Misc, 1 each by Miscellaneous route Four (4) times a day. DX code E11.9, Disp: 100 each, Rfl: 3  ???  medroxyPROGESTERone (PROVERA) 10 MG tablet, medroxyprogesterone 10 mg tablet, Disp: , Rfl:   ???  omeprazole (PRILOSEC) 40 MG capsule, omeprazole 40 mg capsule,delayed release, Disp: , Rfl:   ???  ondansetron (ZOFRAN-ODT) 4 MG disintegrating tablet, ondansetron 4 mg disintegrating tablet, Disp: , Rfl:   ???  polyethylene glycol (GLYCOLAX) 17 gram/dose powder, polyethylene glycol 3350 17 gram/dose oral powder  MIX 1 CAPFUL IN 4 TO 8 OUNCES OF FLUID AND TK  PO QD, Disp: , Rfl:   ???  scopolamine (TRANSDERM-SCOP) 1 mg over 3 days, Place 1 patch (1.5 mg total) on the skin every third day., Disp: 4 patch, Rfl: 1  ???  SUMAtriptan succinate 6 mg/0.5 mL PnIj, Use 1 injection to relieve migraine HA. May repeat after 2 hours if needed., Disp: 2 Syringe, Rfl: 5  ???  tranexamic acid (LYSTEDA) 650 mg Tab tablet, Take 2 tablets (1,300 mg total) by mouth Three (3) times a day., Disp: 60 tablet, Rfl: 0    Allergies  Oxycontin [oxycodone] and Shellfish containing products    Family History   Problem Relation Age of Onset   ??? Diabetes Maternal Aunt    ??? Diabetes Maternal Uncle    ??? Hyperlipidemia Paternal Aunt    ??? Hyperlipidemia Paternal Uncle    ??? Heart disease Maternal Grandmother    ??? Kidney disease Maternal Grandmother    ???  Heart disease Maternal Grandfather    ??? Kidney disease Maternal Grandfather    ??? No Known Problems Mother    ??? No Known Problems Father        Social History  Social History     Tobacco Use   ??? Smoking status: Former Smoker     Packs/day: 0.25     Years: 2.00     Pack years: 0.50     Types: Cigarettes     Quit date: 06/28/2016     Years since quitting: 3.2   ??? Smokeless tobacco: Never Used   Vaping Use   ??? Vaping Use: Never used   Substance Use Topics   ??? Alcohol use: Not Currently   ??? Drug use: Not Currently        Physical Exam   VITAL SIGNS:      Vitals:    10/04/19 0850   BP: 120/70   Pulse: 64   Resp: 16   Temp: 36.4 ??C (97.6 ??F)   TempSrc: Skin   SpO2: 100%   Weight: 97.5 kg (215 lb)       Constitutional: Alert and oriented. Well appearing and in no distress.    Eyes: Conjunctivae clear, baseline horizontal nystagmus.  HEENT: Normocephalic and atraumatic.Conjunctivae clear. No congestion. Moist mucous membranes.   Cardiovascular: Normal rate, regular rhythm. Normal and symmetric distal pulses. Brisk capillary refill. Normal skin turgor.  Respiratory: Normal respiratory effort. Breath sounds are normal. There are no wheezing or crackles heard.  Gastrointestinal: Soft, non-distended.  Very mild suprapubic tenderness to palpation.  No rebound or guarding.  No peritoneal signs.  Musculoskeletal: Nontender with normal range of motion in all extremities.       Right lower leg: No tenderness or edema.       Left lower leg: No tenderness or edema.  Neurologic: Normal speech and language. No gross focal neurologic deficits are appreciated. Patient is moving all extremities equally, face is symmetric at rest and with speech.  Skin: Skin is warm, dry and intact. No rash noted.  Albinism.  Psychiatric: Mood and affect are normal. Speech and behavior are normal.     Radiology No orders to display        Laboratory Data     Lab Results   Component Value Date    WBC 3.5 (L) 10/04/2019    HGB 11.3 (L) 10/04/2019    HCT 35.1 (L) 10/04/2019    PLT 279 10/04/2019       Lab Results   Component Value Date    NA 139 10/04/2019    K 3.6 10/04/2019    CL 107 10/04/2019    CO2 27.0 10/04/2019    BUN 8 (L) 10/04/2019    CREATININE 0.77 10/04/2019    GLU 81 10/04/2019    CALCIUM 9.0 10/04/2019                   Lab Results   Component Value Date    BILITOT 0.7 10/04/2019          PROT 7.0 10/04/2019    ALBUMIN 3.7 10/04/2019    ALT 32 10/04/2019    AST 21 10/04/2019    ALKPHOS 46 10/04/2019     Pertinent labs & imaging results that were available during my care of the patient were reviewed by me and considered in my medical decision making (see chart for details).    Portions of this record have been created using Scientist, clinical (histocompatibility and immunogenetics). Dictation  errors have been sought, but may not have been identified and corrected.       Melene Plan, MD  Resident  10/04/19 [phone]

## 2019-10-05 NOTE — Unmapped (Signed)
Urine Culture  Order: [id number] - Reflex for Order [id number]  Status:  Preliminary result ????    Comprehensive   >100,000 CFU/mL Escherichia coliAbnormal        Specimen Source: Clean Catch

## 2019-10-06 NOTE — Unmapped (Signed)
Urine Culture  Order: [id number] - Reflex for Order [id number]  Status:  Final result ????    >100,000 CFU/mL Escherichia coliAbnormal        Specimen Source: Clean Catch    With susceptibilities posted - tx with nitrofurantoin, macrocrystal-monohydrate, (MACROBID) 100 MG capsule [10724] - tx appropriate

## 2019-10-08 NOTE — Unmapped (Signed)
Per Marzella Schlein Canty-[place], CMA  Not to send at this time. ??She has decided not to get medication for now. ??Marylene Land will let us know if she restarts.

## 2019-10-13 ENCOUNTER — Ambulatory Visit: Admit: 2019-10-13 | Payer: MEDICARE

## 2019-10-13 NOTE — Unmapped (Signed)
This patient has been disenrolled from the [facility] specialty pharmacy services due to patient choosing not to continue.    Ashley Dunlap  [facility] Specialty Pharmacist

## 2019-10-31 ENCOUNTER — Encounter: Admit: 2019-10-31 | Payer: MEDICARE

## 2019-11-06 DIAGNOSIS — Z9884 Bariatric surgery status: Principal | ICD-10-CM

## 2019-11-06 DIAGNOSIS — R109 Unspecified abdominal pain: Principal | ICD-10-CM

## 2019-11-07 ENCOUNTER — Ambulatory Visit: Admit: 2019-11-07 | Payer: MEDICARE

## 2019-11-09 ENCOUNTER — Encounter: Admit: 2019-11-09 | Discharge: 2019-11-14 | Disposition: A | Discharge status: Home / Self Care | Payer: MEDICARE

## 2019-11-09 ENCOUNTER — Ambulatory Visit: Admit: 2019-11-09 | Discharge: 2019-11-14 | Disposition: A | Discharge status: Home / Self Care | Payer: MEDICARE

## 2019-11-09 ENCOUNTER — Encounter
Admit: 2019-11-09 | Discharge: 2019-11-14 | Disposition: A | Discharge status: Home / Self Care | Payer: MEDICARE | Attending: Student in an Organized Health Care Education/Training Program

## 2019-11-14 MED ORDER — ACETAMINOPHEN 160 MG/5 ML ORAL SUSPENSION
Freq: Four times a day (QID) | ORAL | 0 refills | 8.00000 days | Status: CP | PRN
Start: 2019-11-14 — End: ?
  Filled 2019-11-14: qty 236, 3d supply, fill #0

## 2019-11-14 MED ORDER — HYDROMORPHONE 2 MG TABLET
ORAL_TABLET | Freq: Four times a day (QID) | ORAL | 0 refills | 5.00000 days | Status: CP | PRN
Start: 2019-11-14 — End: 2019-11-19
  Filled 2019-11-14: qty 20, 5d supply, fill #0

## 2019-11-14 MED ORDER — POLYETHYLENE GLYCOL 3350 17 GRAM/DOSE ORAL POWDER
Freq: Two times a day (BID) | ORAL | 0 refills | 30.00000 days | Status: CP
Start: 2019-11-14 — End: 2019-12-14
  Filled 2019-11-14: qty 510, 15d supply, fill #0

## 2019-11-14 MED ORDER — METRONIDAZOLE 50MG/ML ORAL SUS
Freq: Three times a day (TID) | ORAL | 0 refills | 14.00000 days | Status: CP
Start: 2019-11-14 — End: 2019-11-28
  Filled 2019-11-14: qty 210, 14d supply, fill #0

## 2019-11-14 MED FILL — ASPERCREME (LIDOCAINE) 4 % TOPICAL PATCH: 5 days supply | Qty: 5 | Fill #0 | Status: AC

## 2019-11-14 MED FILL — HYDROMORPHONE 2 MG TABLET: 5 days supply | Qty: 20 | Fill #0 | Status: AC

## 2019-11-14 MED FILL — ORA-PLUS ORAL SUSPENSION, METRONIDAZOLE 500 MG TABLET: 14 days supply | Qty: 210 | Fill #0 | Status: AC

## 2019-11-14 MED FILL — CHILDREN'S PAIN AND FEVER RELIEF 160 MG/5 ML ORAL SUSPENSION: 3 days supply | Qty: 236 | Fill #0 | Status: AC

## 2019-11-14 MED FILL — SILACE 50 MG/5 ML ORAL LIQUID: 30 days supply | Qty: 300 | Fill #0 | Status: AC

## 2019-11-14 MED FILL — GAVILAX 17 GRAM/DOSE ORAL POWDER: 15 days supply | Qty: 510 | Fill #0 | Status: AC

## 2019-11-15 MED ORDER — DOCUSATE SODIUM 50 MG/5 ML ORAL LIQUID
Freq: Every day | ORAL | 0 refills | 30.00000 days | Status: CP
Start: 2019-11-15 — End: 2019-12-15
  Filled 2019-11-14: qty 300, 30d supply, fill #0

## 2019-11-15 MED ORDER — LIDOCAINE 4 % TOPICAL PATCH
MEDICATED_PATCH | Freq: Every day | TRANSDERMAL | 0 refills | 15.00000 days | Status: CP
Start: 2019-11-15 — End: 2019-11-30
  Filled 2019-11-14: qty 5, 5d supply, fill #0

## 2019-11-24 ENCOUNTER — Telehealth: Admit: 2019-11-24 | Discharge: 2019-11-25 | Payer: MEDICARE

## 2019-11-24 DIAGNOSIS — Z9889 Other specified postprocedural states: Principal | ICD-10-CM

## 2019-11-26 ENCOUNTER — Ambulatory Visit: Admit: 2019-11-26 | Discharge: 2019-11-27 | Payer: MEDICARE

## 2019-12-11 ENCOUNTER — Encounter: Admit: 2019-12-11 | Discharge: 2019-12-12 | Payer: MEDICARE

## 2019-12-11 MED ORDER — VITAMIN B COMPLEX-FOLIC ACID 0.4 MG TABLET
ORAL_TABLET | Freq: Every day | ORAL | 3 refills | 90.00000 days | Status: CP
Start: 2019-12-11 — End: ?

## 2019-12-12 ENCOUNTER — Ambulatory Visit: Admit: 2019-12-12 | Discharge: 2019-12-13 | Payer: MEDICARE

## 2019-12-12 DIAGNOSIS — O0991 Supervision of high risk pregnancy, unspecified, first trimester: Principal | ICD-10-CM

## 2019-12-18 ENCOUNTER — Encounter: Admit: 2019-12-18 | Discharge: 2019-12-19 | Payer: MEDICARE

## 2019-12-18 DIAGNOSIS — R112 Nausea with vomiting, unspecified: Principal | ICD-10-CM

## 2019-12-18 DIAGNOSIS — Z3A01 Less than 8 weeks gestation of pregnancy: Principal | ICD-10-CM

## 2019-12-18 DIAGNOSIS — Z3490 Encounter for supervision of normal pregnancy, unspecified, unspecified trimester: Principal | ICD-10-CM

## 2019-12-18 MED ORDER — DOXYLAMINE 10 MG-PYRIDOXINE (VIT B6) 10 MG TABLET,DELAYED RELEASE
ORAL_TABLET | Freq: Every evening | ORAL | 3 refills | 15.00000 days | Status: CP
Start: 2019-12-18 — End: ?

## 2019-12-24 DIAGNOSIS — B9689 Other specified bacterial agents as the cause of diseases classified elsewhere: Principal | ICD-10-CM

## 2019-12-24 DIAGNOSIS — N898 Other specified noninflammatory disorders of vagina: Principal | ICD-10-CM

## 2019-12-24 DIAGNOSIS — O23599 Infection of other part of genital tract in pregnancy, unspecified trimester: Principal | ICD-10-CM

## 2019-12-24 MED ORDER — METRONIDAZOLE 500 MG TABLET
ORAL_TABLET | Freq: Two times a day (BID) | ORAL | 0 refills | 7 days | Status: CP
Start: 2019-12-24 — End: 2019-12-31

## 2019-12-25 ENCOUNTER — Ambulatory Visit: Admit: 2019-12-25 | Discharge: 2019-12-26 | Payer: MEDICARE

## 2019-12-25 MED ORDER — METRONIDAZOLE 1 % TOPICAL GEL
Freq: Every day | TOPICAL | 0 refills | 0 days | Status: CP
Start: 2019-12-25 — End: 2020-12-24

## 2019-12-29 DIAGNOSIS — O26891 Other specified pregnancy related conditions, first trimester: Principal | ICD-10-CM

## 2019-12-29 DIAGNOSIS — N3000 Acute cystitis without hematuria: Principal | ICD-10-CM

## 2019-12-29 DIAGNOSIS — R300 Dysuria: Principal | ICD-10-CM

## 2019-12-29 MED ORDER — CEPHALEXIN 500 MG CAPSULE
ORAL_CAPSULE | Freq: Four times a day (QID) | ORAL | 0 refills | 7 days | Status: CP
Start: 2019-12-29 — End: 2020-01-05

## 2019-12-30 ENCOUNTER — Ambulatory Visit: Admit: 2019-12-30 | Payer: MEDICARE

## 2019-12-31 ENCOUNTER — Ambulatory Visit
Admit: 2019-12-31 | Payer: MEDICARE | Attending: Student in an Organized Health Care Education/Training Program | Primary: Student in an Organized Health Care Education/Training Program

## 2020-02-03 DIAGNOSIS — Z3A12 12 weeks gestation of pregnancy: Principal | ICD-10-CM

## 2020-02-03 DIAGNOSIS — K219 Gastro-esophageal reflux disease without esophagitis: Principal | ICD-10-CM

## 2020-02-03 DIAGNOSIS — R001 Bradycardia, unspecified: Principal | ICD-10-CM

## 2020-02-03 DIAGNOSIS — E1142 Type 2 diabetes mellitus with diabetic polyneuropathy: Principal | ICD-10-CM

## 2020-02-03 DIAGNOSIS — H9042 Sensorineural hearing loss, unilateral, left ear, with unrestricted hearing on the contralateral side: Principal | ICD-10-CM

## 2020-02-03 DIAGNOSIS — O99281 Endocrine, nutritional and metabolic diseases complicating pregnancy, first trimester: Principal | ICD-10-CM

## 2020-02-03 DIAGNOSIS — O0901 Supervision of pregnancy with history of infertility, first trimester: Principal | ICD-10-CM

## 2020-02-03 DIAGNOSIS — O99611 Diseases of the digestive system complicating pregnancy, first trimester: Principal | ICD-10-CM

## 2020-02-03 DIAGNOSIS — O99891 Other specified diseases and conditions complicating pregnancy: Principal | ICD-10-CM

## 2020-02-03 DIAGNOSIS — Z885 Allergy status to narcotic agent status: Principal | ICD-10-CM

## 2020-02-03 DIAGNOSIS — Z9884 Bariatric surgery status: Principal | ICD-10-CM

## 2020-02-03 DIAGNOSIS — E860 Dehydration: Principal | ICD-10-CM

## 2020-02-03 DIAGNOSIS — Z79899 Other long term (current) drug therapy: Principal | ICD-10-CM

## 2020-02-03 DIAGNOSIS — Z87891 Personal history of nicotine dependence: Principal | ICD-10-CM

## 2020-02-03 DIAGNOSIS — Z20822 Contact with and (suspected) exposure to covid-19: Principal | ICD-10-CM

## 2020-02-03 DIAGNOSIS — O99211 Obesity complicating pregnancy, first trimester: Principal | ICD-10-CM

## 2020-02-03 DIAGNOSIS — O99341 Other mental disorders complicating pregnancy, first trimester: Principal | ICD-10-CM

## 2020-02-03 DIAGNOSIS — O99011 Anemia complicating pregnancy, first trimester: Principal | ICD-10-CM

## 2020-02-03 DIAGNOSIS — D573 Sickle-cell trait: Principal | ICD-10-CM

## 2020-02-03 DIAGNOSIS — F419 Anxiety disorder, unspecified: Principal | ICD-10-CM

## 2020-02-03 DIAGNOSIS — R55 Syncope and collapse: Principal | ICD-10-CM

## 2020-02-04 ENCOUNTER — Encounter: Admit: 2020-02-04 | Discharge: 2020-02-04 | Disposition: A | Discharge status: Home / Self Care | Payer: MEDICARE

## 2020-02-04 DIAGNOSIS — O99281 Endocrine, nutritional and metabolic diseases complicating pregnancy, first trimester: Principal | ICD-10-CM

## 2020-02-04 DIAGNOSIS — O99211 Obesity complicating pregnancy, first trimester: Principal | ICD-10-CM

## 2020-02-04 DIAGNOSIS — Z79899 Other long term (current) drug therapy: Principal | ICD-10-CM

## 2020-02-04 DIAGNOSIS — Z885 Allergy status to narcotic agent status: Principal | ICD-10-CM

## 2020-02-04 DIAGNOSIS — Z20822 Contact with and (suspected) exposure to covid-19: Principal | ICD-10-CM

## 2020-02-04 DIAGNOSIS — F419 Anxiety disorder, unspecified: Principal | ICD-10-CM

## 2020-02-04 DIAGNOSIS — O99611 Diseases of the digestive system complicating pregnancy, first trimester: Principal | ICD-10-CM

## 2020-02-04 DIAGNOSIS — O99011 Anemia complicating pregnancy, first trimester: Principal | ICD-10-CM

## 2020-02-04 DIAGNOSIS — H9042 Sensorineural hearing loss, unilateral, left ear, with unrestricted hearing on the contralateral side: Principal | ICD-10-CM

## 2020-02-04 DIAGNOSIS — R55 Syncope and collapse: Principal | ICD-10-CM

## 2020-02-04 DIAGNOSIS — E1142 Type 2 diabetes mellitus with diabetic polyneuropathy: Principal | ICD-10-CM

## 2020-02-04 DIAGNOSIS — R001 Bradycardia, unspecified: Principal | ICD-10-CM

## 2020-02-04 DIAGNOSIS — K219 Gastro-esophageal reflux disease without esophagitis: Principal | ICD-10-CM

## 2020-02-04 DIAGNOSIS — O99341 Other mental disorders complicating pregnancy, first trimester: Principal | ICD-10-CM

## 2020-02-04 DIAGNOSIS — O0901 Supervision of pregnancy with history of infertility, first trimester: Principal | ICD-10-CM

## 2020-02-04 DIAGNOSIS — Z3A12 12 weeks gestation of pregnancy: Principal | ICD-10-CM

## 2020-02-04 DIAGNOSIS — D573 Sickle-cell trait: Principal | ICD-10-CM

## 2020-02-04 DIAGNOSIS — E860 Dehydration: Principal | ICD-10-CM

## 2020-02-04 DIAGNOSIS — Z87891 Personal history of nicotine dependence: Principal | ICD-10-CM

## 2020-02-04 DIAGNOSIS — O99891 Other specified diseases and conditions complicating pregnancy: Principal | ICD-10-CM

## 2020-02-04 DIAGNOSIS — Z9884 Bariatric surgery status: Principal | ICD-10-CM

## 2020-02-04 MED ORDER — METOCLOPRAMIDE 10 MG TABLET
ORAL_TABLET | Freq: Three times a day (TID) | ORAL | 0 refills | 10 days | Status: CP | PRN
Start: 2020-02-04 — End: 2020-02-19

## 2020-02-05 DIAGNOSIS — O26899 Other specified pregnancy related conditions, unspecified trimester: Secondary | ICD-10-CM | POA: Insufficient documentation

## 2020-02-05 DIAGNOSIS — F32A Depression, unspecified: Secondary | ICD-10-CM | POA: Insufficient documentation

## 2020-02-05 DIAGNOSIS — O99340 Other mental disorders complicating pregnancy, unspecified trimester: Secondary | ICD-10-CM | POA: Insufficient documentation

## 2020-02-19 ENCOUNTER — Encounter
Admit: 2020-02-19 | Discharge: 2020-02-20 | Payer: MEDICARE | Attending: Student in an Organized Health Care Education/Training Program | Primary: Student in an Organized Health Care Education/Training Program

## 2020-02-19 DIAGNOSIS — Z79899 Other long term (current) drug therapy: Principal | ICD-10-CM

## 2020-02-19 MED ORDER — ARIPIPRAZOLE 10 MG TABLET
ORAL_TABLET | Freq: Every day | ORAL | 1 refills | 30.00000 days | Status: CP
Start: 2020-02-19 — End: 2020-03-18

## 2020-03-18 ENCOUNTER — Encounter
Admit: 2020-03-18 | Discharge: 2020-03-19 | Payer: MEDICARE | Attending: Student in an Organized Health Care Education/Training Program | Primary: Student in an Organized Health Care Education/Training Program

## 2020-03-18 DIAGNOSIS — Z79899 Other long term (current) drug therapy: Principal | ICD-10-CM

## 2020-03-18 MED ORDER — ARIPIPRAZOLE 10 MG TABLET
ORAL_TABLET | Freq: Every day | ORAL | 2 refills | 30 days | Status: CP
Start: 2020-03-18 — End: 2020-06-16

## 2020-04-16 DIAGNOSIS — O99019 Anemia complicating pregnancy, unspecified trimester: Secondary | ICD-10-CM | POA: Insufficient documentation

## 2020-04-16 DIAGNOSIS — D509 Iron deficiency anemia, unspecified: Secondary | ICD-10-CM | POA: Insufficient documentation

## 2020-04-26 ENCOUNTER — Telehealth: Admit: 2020-04-26 | Discharge: 2020-04-27 | Payer: MEDICARE | Attending: Family | Primary: Family

## 2020-04-26 DIAGNOSIS — Z6824 Body mass index (BMI) 24.0-24.9, adult: Principal | ICD-10-CM

## 2020-04-26 DIAGNOSIS — R634 Abnormal weight loss: Principal | ICD-10-CM

## 2020-04-26 DIAGNOSIS — R799 Abnormal finding of blood chemistry, unspecified: Principal | ICD-10-CM

## 2020-04-26 DIAGNOSIS — Z1321 Encounter for screening for nutritional disorder: Principal | ICD-10-CM

## 2020-04-26 DIAGNOSIS — Z9884 Bariatric surgery status: Principal | ICD-10-CM

## 2020-04-26 DIAGNOSIS — F3170 Bipolar disorder, currently in remission, most recent episode unspecified: Principal | ICD-10-CM

## 2020-05-13 DIAGNOSIS — R002 Palpitations: Secondary | ICD-10-CM | POA: Insufficient documentation

## 2020-05-20 DIAGNOSIS — Z302 Encounter for sterilization: Secondary | ICD-10-CM | POA: Insufficient documentation

## 2020-05-20 DIAGNOSIS — R55 Syncope and collapse: Secondary | ICD-10-CM | POA: Insufficient documentation

## 2020-06-03 ENCOUNTER — Encounter
Admit: 2020-06-03 | Discharge: 2020-06-04 | Payer: MEDICARE | Attending: Student in an Organized Health Care Education/Training Program | Primary: Student in an Organized Health Care Education/Training Program

## 2020-06-03 DIAGNOSIS — F3170 Bipolar disorder, currently in remission, most recent episode unspecified: Principal | ICD-10-CM

## 2020-06-03 MED ORDER — ARIPIPRAZOLE 10 MG TABLET
ORAL_TABLET | Freq: Every day | ORAL | 2 refills | 30.00000 days | Status: CP
Start: 2020-06-03 — End: 2020-09-01

## 2020-06-04 ENCOUNTER — Telehealth: Admit: 2020-06-04 | Discharge: 2020-06-05 | Payer: MEDICARE | Attending: Registered" | Primary: Registered"

## 2020-06-23 DIAGNOSIS — O219 Vomiting of pregnancy, unspecified: Secondary | ICD-10-CM | POA: Insufficient documentation

## 2020-06-25 ENCOUNTER — Ambulatory Visit: Admit: 2020-06-25 | Payer: MEDICARE | Attending: Ophthalmology | Primary: Ophthalmology

## 2020-07-12 DIAGNOSIS — K769 Liver disease, unspecified: Secondary | ICD-10-CM | POA: Insufficient documentation

## 2020-07-13 ENCOUNTER — Encounter: Payer: Self-pay | Admitting: Obstetrics and Gynecology

## 2020-07-13 ENCOUNTER — Observation Stay
Admission: EM | Admit: 2020-07-13 | Discharge: 2020-07-13 | Disposition: A | Discharge status: Home / Self Care | Payer: 59 | Attending: Obstetrics and Gynecology | Admitting: Obstetrics and Gynecology

## 2020-07-13 ENCOUNTER — Other Ambulatory Visit: Payer: Self-pay

## 2020-07-13 DIAGNOSIS — E119 Type 2 diabetes mellitus without complications: Secondary | ICD-10-CM | POA: Diagnosis not present

## 2020-07-13 DIAGNOSIS — Z3A34 34 weeks gestation of pregnancy: Secondary | ICD-10-CM | POA: Insufficient documentation

## 2020-07-13 DIAGNOSIS — O4703 False labor before 37 completed weeks of gestation, third trimester: Principal | ICD-10-CM | POA: Insufficient documentation

## 2020-07-13 DIAGNOSIS — D509 Iron deficiency anemia, unspecified: Secondary | ICD-10-CM | POA: Insufficient documentation

## 2020-07-13 DIAGNOSIS — O99343 Other mental disorders complicating pregnancy, third trimester: Secondary | ICD-10-CM | POA: Insufficient documentation

## 2020-07-13 DIAGNOSIS — Z87891 Personal history of nicotine dependence: Secondary | ICD-10-CM | POA: Insufficient documentation

## 2020-07-13 DIAGNOSIS — O99013 Anemia complicating pregnancy, third trimester: Secondary | ICD-10-CM | POA: Diagnosis not present

## 2020-07-13 DIAGNOSIS — F32A Depression, unspecified: Secondary | ICD-10-CM | POA: Insufficient documentation

## 2020-07-13 DIAGNOSIS — Z79899 Other long term (current) drug therapy: Secondary | ICD-10-CM | POA: Diagnosis not present

## 2020-07-13 DIAGNOSIS — D519 Vitamin B12 deficiency anemia, unspecified: Secondary | ICD-10-CM | POA: Diagnosis not present

## 2020-07-13 DIAGNOSIS — O24113 Pre-existing diabetes mellitus, type 2, in pregnancy, third trimester: Secondary | ICD-10-CM | POA: Insufficient documentation

## 2020-07-13 DIAGNOSIS — E538 Deficiency of other specified B group vitamins: Secondary | ICD-10-CM | POA: Insufficient documentation

## 2020-07-13 DIAGNOSIS — O479 False labor, unspecified: Secondary | ICD-10-CM | POA: Diagnosis present

## 2020-07-13 HISTORY — DX: Sickle-cell trait: D57.3

## 2020-07-13 LAB — URINALYSIS, COMPLETE (UACMP) WITH MICROSCOPIC
Bacteria, UA: NONE SEEN
Bilirubin Urine: NEGATIVE
Glucose, UA: NEGATIVE mg/dL
Hgb urine dipstick: NEGATIVE
Ketones, ur: NEGATIVE mg/dL
Leukocytes,Ua: NEGATIVE
Nitrite: NEGATIVE
Protein, ur: NEGATIVE mg/dL
Specific Gravity, Urine: 1.01 (ref 1.005–1.030)
pH: 8 (ref 5.0–8.0)

## 2020-07-13 LAB — CHLAMYDIA/NGC RT PCR (ARMC ONLY)
Chlamydia Tr: NOT DETECTED
N gonorrhoeae: NOT DETECTED

## 2020-07-13 LAB — WET PREP, GENITAL
Clue Cells Wet Prep HPF POC: NONE SEEN
Sperm: NONE SEEN
Trich, Wet Prep: NONE SEEN
Yeast Wet Prep HPF POC: NONE SEEN

## 2020-07-13 MED ORDER — ACETAMINOPHEN 500 MG PO TABS
1000.0000 mg | ORAL_TABLET | Freq: Four times a day (QID) | ORAL | Status: DC | PRN
  Administered 2020-07-13: 1000 mg via ORAL
  Filled 2020-07-13: qty 2

## 2020-07-13 MED ORDER — CALCIUM CARBONATE ANTACID 500 MG PO CHEW: 2.0000 | CHEWABLE_TABLET | ORAL | Status: DC | PRN

## 2020-07-13 NOTE — OB Triage Note (Signed)
Pt G1P0 at [redacted]w[redacted]d presents to [facility] L&D triage from ED via EMS with c/o of pressure in her vagina/butt that she describes as water balloon sensation. Pt also states she has lower abd/back and leg pain. Pt states pain started around midnight. Reports +FM. Denies LOF/bleeding. Pt reports she took tylenol earlier today but unsure on dosage. Pt seen at [facility] and plans to deliver at [facility]. Pt states she is high risk due to vasovagal syncope. Pt reports no other complications this pregnancy. VSS. Monitors applied. Will continue to monitor.

## 2020-07-13 NOTE — Discharge Summary (Signed)
Kristen Griffith is a 30 y.o. female. She is at [redacted]w[redacted]d gestation. No LMP recorded. Patient is pregnant. Estimated Date of Delivery: 08/18/20  Prenatal care site: [facility]   Chief complaint: braxton hicks contractions and cramping, feels like " a balloon is pushing down in my vagina"   HPI: Kristen Griffith presents to L&D with complaints of lower abdominal cramping and pelvic pressure.  She reports infrequent braxton hicks contractions, about 2 an hour.  The cramping started early this morning and has become more period-like.  She also reports feeling like a "water balloon" is pushing into her pelvis since around midnight.  She denies vaginal bleeding or LOF.  Endorses good fetal movement.   Factors complicating pregnancy: 1. Type II DM without complication  2. Maternal iron deficiency anemia in pregnancy  3. Bipolar d/o  4. Depression complicating pregnancy with history of suicide attempt x 4 5. History of bariatric surgery  6. Hepatic adenoma - needs MRI postpartum  7. B12 deficiency in pregnancy  8. Sickle cell trait  9. Vasovagal syncope  10. Desires permanent sterilization - BTL consents signed 05/21/2020  S: Resting comfortably, no VB.no LOF,  Active fetal movement.   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    Past Surgical Hx:  has a past surgical history that includes Bariatric Surgery (2020).   Allergies  Allergen Reactions  . Oxycodone Itching  . Shellfish Allergy Itching     Prior to Admission medications   Medication Sig Start Date End Date Taking? Authorizing Provider  Doxylamine-Pyridoxine 10-10 MG TBEC Take by mouth.   Yes [provider]  famotidine (PEPCID) 20 MG tablet Take 20 mg by mouth 2 (two) times daily.   Yes [provider]  folic acid (FOLVITE) 829 MCG tablet Take 400 mcg by mouth daily.   Yes  [provider]  metolazone (ZAROXOLYN) 10 MG tablet Take 10 mg by mouth daily.   Yes [provider]  ondansetron (ZOFRAN) 8 MG tablet Take by mouth every 8 (eight) hours as needed for nausea or vomiting.   Yes [provider]  pantoprazole (PROTONIX) 40 MG tablet Take 40 mg by mouth daily.   Yes [provider]  Prenatal Vit-Fe Fumarate-FA (MULTIVITAMIN-PRENATAL) 27-0.8 MG TABS tablet Take 1 tablet by mouth daily at 12 noon.   Yes [provider]  promethazine (PHENERGAN) 25 MG tablet Take 25 mg by mouth every 6 (six) hours as needed for nausea or vomiting.   Yes [provider]  sucralfate (CARAFATE) 1 GM/10ML suspension Take 1 g by mouth 4 (four) times daily -  with meals and at bedtime.   Yes [provider]  vitamin B-12 (CYANOCOBALAMIN) 500 MCG tablet Take 500 mcg by mouth daily.   Yes [provider]    Social History: She  reports that she has quit smoking. She has never used smokeless tobacco. She reports previous alcohol use. She reports that she does not use drugs.  Family History: family history includes Bipolar disorder in her father; Diabetes in her maternal grandfather and maternal grandmother; Endometriosis in her maternal grandmother and mother; Glaucoma in her maternal grandfather; High blood pressure in her sister; Personality disorder in her father; Schizophrenia in her father; Sickle cell anemia in her sister; Stroke in her maternal grandfather.   Review of Systems: A full review of systems was performed and negative except  as noted in the HPI.    O:  BP 96/64   Pulse 81   Temp 98 F (36.7 C) (Oral)  Results for orders placed or performed during the hospital encounter of 07/13/20 (from the past 48 hour(s))  Wet prep, genital   Collection Time: 07/13/20  1:27 PM  Result Value Ref Range   Yeast Wet Prep HPF POC NONE SEEN NONE SEEN   Trich, Wet Prep NONE SEEN NONE SEEN   Clue Cells Wet Prep HPF POC  NONE SEEN NONE SEEN   WBC, Wet Prep HPF POC MODERATE (A) NONE SEEN   Sperm NONE SEEN   Chlamydia/NGC rt PCR ([facility] only)   Collection Time: 07/13/20  1:27 PM   Specimen: Cervical/Vaginal swab; Genital  Result Value Ref Range   Specimen source GC/Chlam ENDOCERVICAL    Chlamydia Tr NOT DETECTED NOT DETECTED   N gonorrhoeae NOT DETECTED NOT DETECTED  Urinalysis, Complete w Microscopic Urine, Clean Catch   Collection Time: 07/13/20  1:27 PM  Result Value Ref Range   Color, Urine YELLOW (A) YELLOW   APPearance CLEAR (A) CLEAR   Specific Gravity, Urine 1.010 1.005 - 1.030   pH 8.0 5.0 - 8.0   Glucose, UA NEGATIVE NEGATIVE mg/dL   Hgb urine dipstick NEGATIVE NEGATIVE   Bilirubin Urine NEGATIVE NEGATIVE   Ketones, ur NEGATIVE NEGATIVE mg/dL   Protein, ur NEGATIVE NEGATIVE mg/dL   Nitrite NEGATIVE NEGATIVE   Leukocytes,Ua NEGATIVE NEGATIVE   WBC, UA 0-5 0 - 5 WBC/hpf   Bacteria, UA NONE SEEN NONE SEEN   Squamous Epithelial / LPF 0-5 0 - 5   Mucus PRESENT      Constitutional: NAD, AAOx3  HE/ENT: extraocular movements grossly intact, moist mucous membranes CV: RRR PULM: nl respiratory effort, CTABL Abd: gravid, non-tender, non-distended, soft  Ext: Non-tender, Nonedmeatous Psych: mood appropriate, speech normal Pelvic : deferred SVE: FT/Th/high/post    Fetal Monitor: Baseline: 135 bpm Variability: moderate Accels: Present Decels: none Toco: irregular mild contractions    Category: I   Assessment: 30 y.o. [redacted]w[redacted]d here for antenatal surveillance during pregnancy.  Principle diagnosis: Uterine contractions in pregnancy    Plan:  Labor: not present.   Fetal Wellbeing: Reassuring Cat 1 tracing.  Reactive NST   Wet prep and GC/CT negative   PTL precautions reviewed   Recommend pelvic rest until symptoms resolved   D/c home stable, precautions reviewed, follow-up as scheduled.   ----- Drinda Butts, CNM Certified Nurse Midwife [facility]

## 2020-07-13 NOTE — Progress Notes (Signed)
Pt given discharge paperwork including  preterm labor precautions. Pt discharged in stable condition with her mom

## 2020-07-15 LAB — URINE CULTURE

## 2020-07-22 ENCOUNTER — Encounter
Admit: 2020-07-22 | Discharge: 2020-07-23 | Payer: MEDICARE | Attending: Student in an Organized Health Care Education/Training Program | Primary: Student in an Organized Health Care Education/Training Program

## 2020-07-22 MED ORDER — ARIPIPRAZOLE 5 MG TABLET
ORAL_TABLET | Freq: Every day | ORAL | 0 refills | 60 days | Status: CP
Start: 2020-07-22 — End: 2020-09-20

## 2020-08-20 IMAGING — CT CT HEAD WITHOUT CONTRAST
5 of 9 series · 16 of 47 positions shown, 17 images · non-contrast
Comparison: None.

CLINICAL DATA: 28-year-old female with head trauma.

EXAM:
CT HEAD WITHOUT CONTRAST
CT CERVICAL SPINE WITHOUT CONTRAST
TECHNIQUE: Multidetector CT imaging of the head and cervical spine was
performed following the standard protocol without intravenous
contrast. Multiplanar CT image reconstructions of the cervical spine
were also generated.

[Series 5: head bone · axial · 0.42mm/px · z∈[-52,+6]mm · 2 of 87 slices shown, 3 images]
[im 29/87  brain]
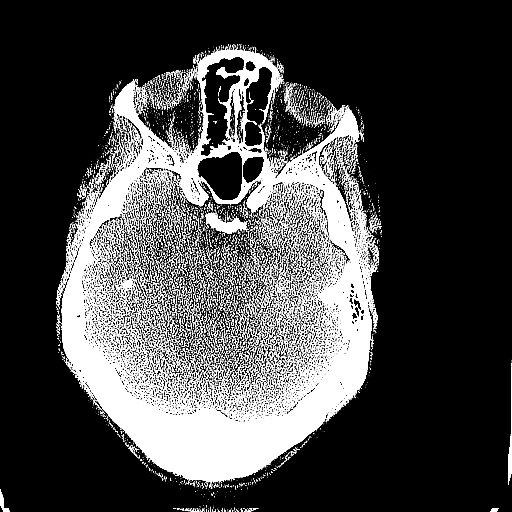
[im 29/87  bone]
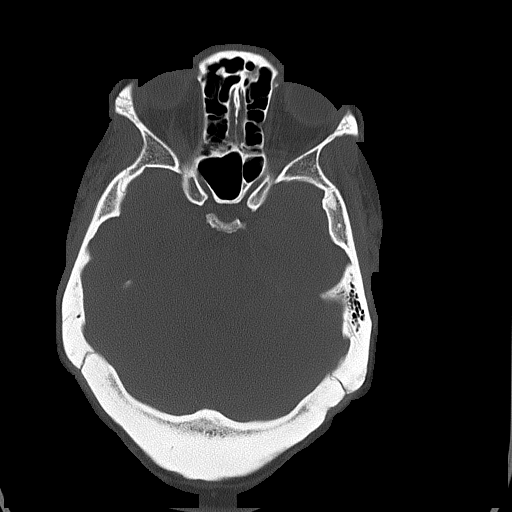
[im 58/87  brain]
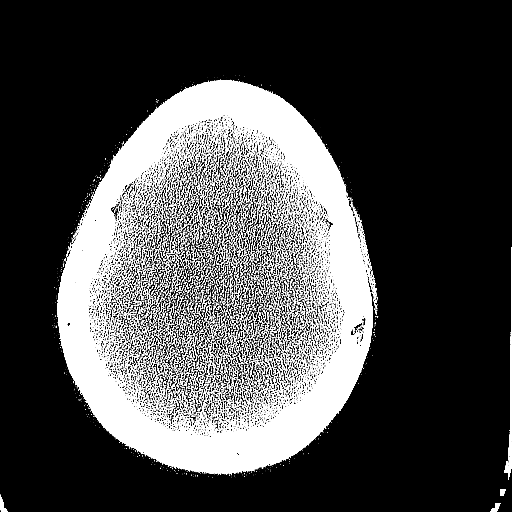

[Series 6: head without cor · coronal · non-contrast · 0.34mm/px · 3 of 74 slices shown]
[im 28/74  brain]
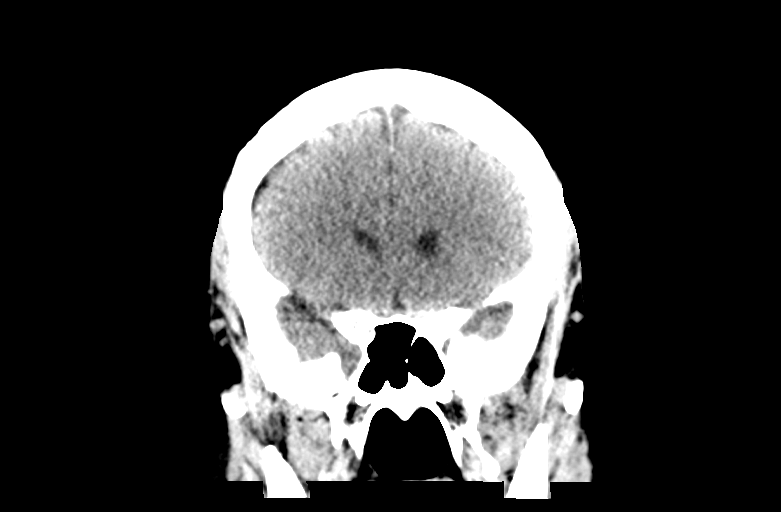
[im 37/74  brain]
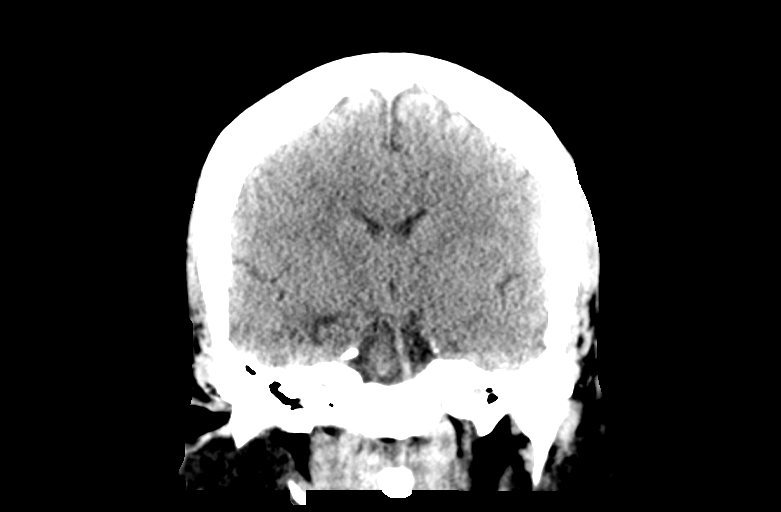
[im 46/74  brain]
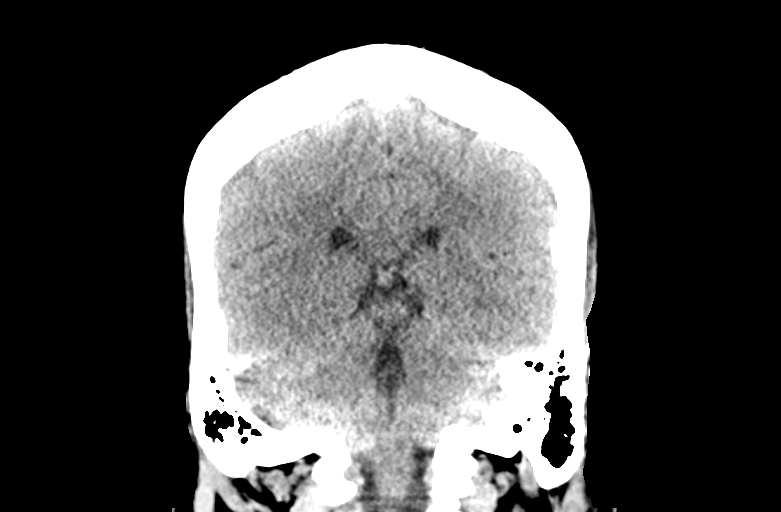

[Series 7: head without sag · sagittal · non-contrast · 0.34mm/px · 2 of 67 slices shown]
[im 23/67  brain]
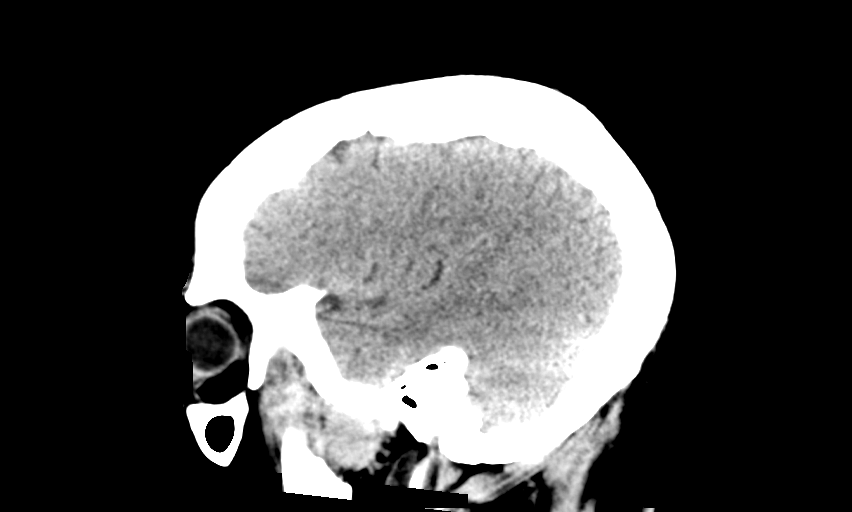
[im 45/67  brain]
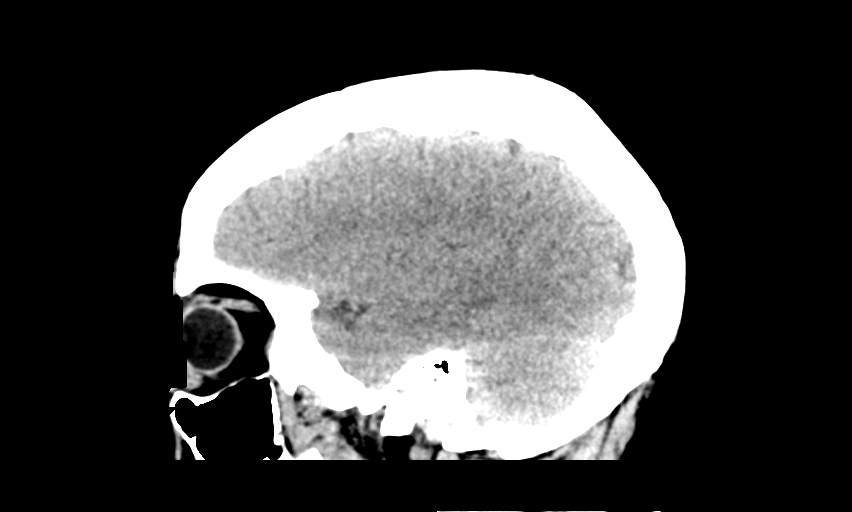

[Series 12: c_spine 2.0 orthogonals · axial · 0.21mm/px · z∈[-238,-154]mm · 3 of 98 slices shown]
[im 25/98  brain]
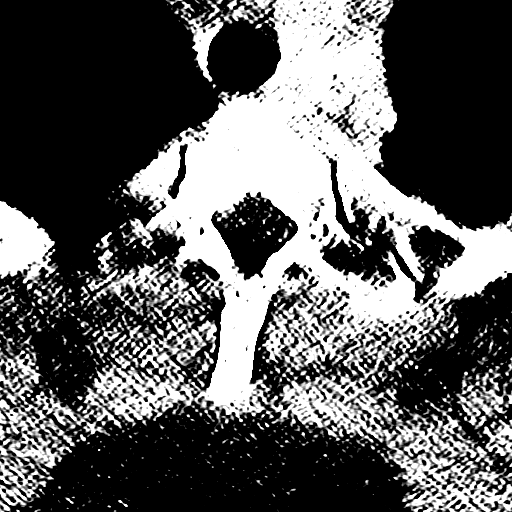
[im 49/98  brain]
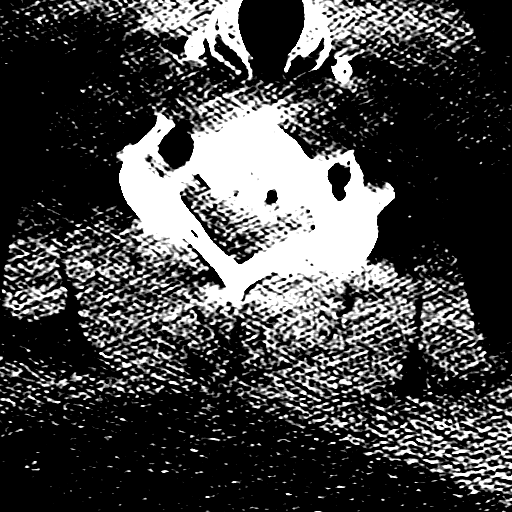
[im 73/98  brain]
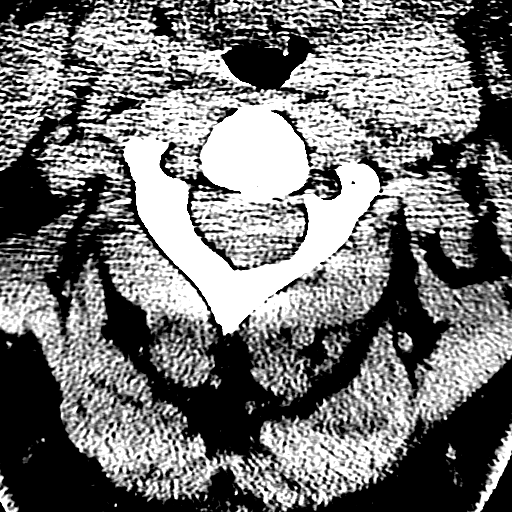

[Series 14: c_spine 1.0 3 bone thins · axial · 0.35mm/px · z∈[-258,-132]mm · 6 of 318 slices shown]
[im 23/318  bone]
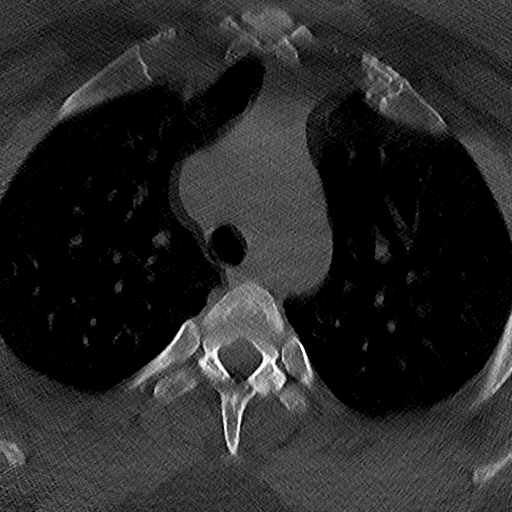
[im 68/318  bone]
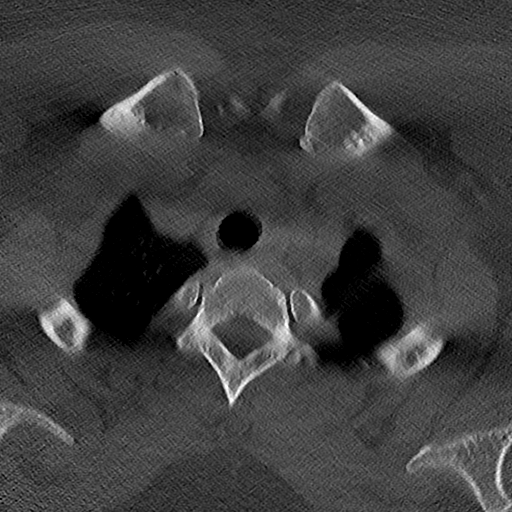
[im 114/318  bone]
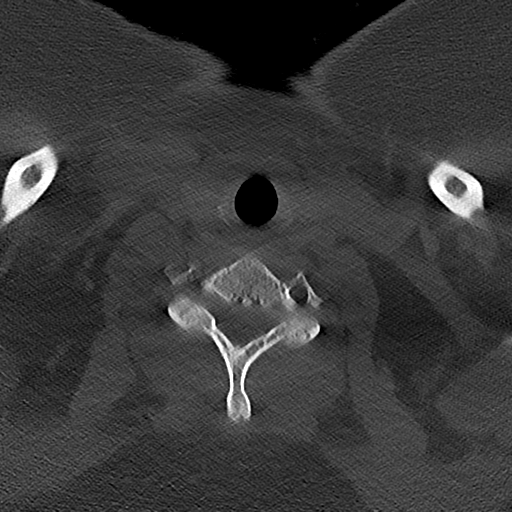
[im 136/318  bone]
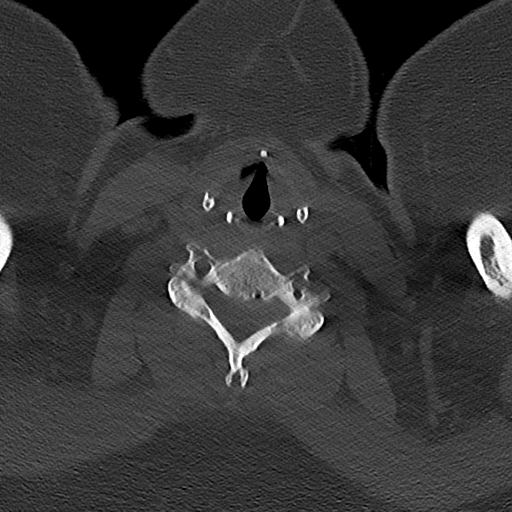
[im 182/318  bone]
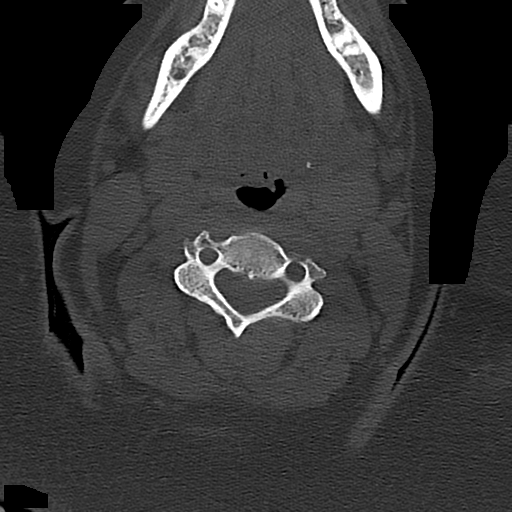
[im 204/318  bone]
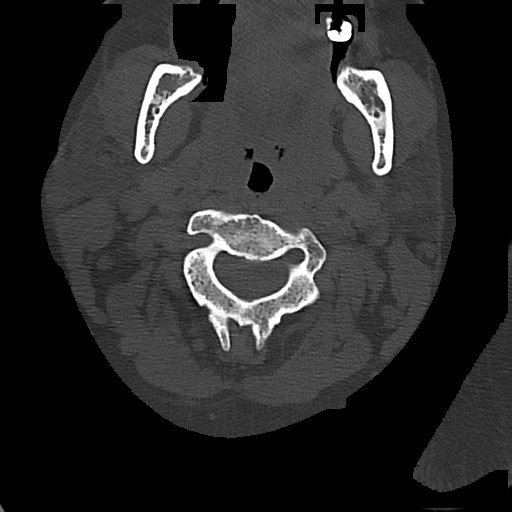

[16 of 47 positions shown; findings below may reference images not displayed]

FINDINGS: CT HEAD FINDINGS

Brain: No evidence of acute infarction, hemorrhage, hydrocephalus,
extra-axial collection or mass lesion/mass effect.

Vascular: No hyperdense vessel or unexpected calcification.

Skull: Normal. Negative for fracture or focal lesion.

Sinuses/Orbits: No acute finding.

Other: None

CT CERVICAL SPINE FINDINGS

Alignment: No acute subluxation. There is straightening of normal
cervical lordosis which may be positional or due to muscle spasm.

Skull base and vertebrae: No acute fracture.

Soft tissues and spinal canal: No prevertebral fluid or swelling. No
visible canal hematoma.

Disc levels:  No acute finding. No significant degenerative changes.

Upper chest: Negative.

Other: None
IMPRESSION: 1. Normal unenhanced CT of the brain.
2. No acute/traumatic cervical spine pathology.

## 2020-08-26 DIAGNOSIS — G4700 Insomnia, unspecified: Principal | ICD-10-CM

## 2020-08-26 MED ORDER — TRAZODONE 50 MG TABLET
ORAL_TABLET | 0 refills | 0 days | Status: CP
Start: 2020-08-26 — End: ?

## 2020-08-26 MED ORDER — CLONAZEPAM 0.5 MG TABLET
ORAL_TABLET | Freq: Every evening | ORAL | 0 refills | 10 days | Status: CP | PRN
Start: 2020-08-26 — End: ?

## 2020-09-09 ENCOUNTER — Encounter
Admit: 2020-09-09 | Discharge: 2020-09-10 | Payer: MEDICARE | Attending: Student in an Organized Health Care Education/Training Program | Primary: Student in an Organized Health Care Education/Training Program

## 2020-09-09 DIAGNOSIS — G4700 Insomnia, unspecified: Principal | ICD-10-CM

## 2020-09-09 DIAGNOSIS — F3170 Bipolar disorder, currently in remission, most recent episode unspecified: Principal | ICD-10-CM

## 2020-09-09 DIAGNOSIS — Z79899 Other long term (current) drug therapy: Principal | ICD-10-CM

## 2020-09-09 MED ORDER — ARIPIPRAZOLE 5 MG TABLET
ORAL_TABLET | ORAL | 0 refills | 28 days | Status: CP
Start: 2020-09-09 — End: 2020-10-07

## 2020-09-09 MED ORDER — HYDROXYZINE HCL 25 MG TABLET
ORAL_TABLET | Freq: Two times a day (BID) | ORAL | 1 refills | 30 days | Status: CP | PRN
Start: 2020-09-09 — End: ?

## 2020-09-28 ENCOUNTER — Institutional Professional Consult (permissible substitution): Admit: 2020-09-28 | Discharge: 2020-09-29 | Payer: MEDICARE

## 2020-10-13 ENCOUNTER — Emergency Department: Admit: 2020-10-13 | Discharge: 2020-10-13 | Disposition: A | Discharge status: Home / Self Care | Payer: MEDICARE

## 2020-10-13 ENCOUNTER — Ambulatory Visit: Admit: 2020-10-13 | Discharge: 2020-10-13 | Disposition: A | Discharge status: Home / Self Care | Payer: MEDICARE

## 2020-10-13 DIAGNOSIS — L0231 Cutaneous abscess of buttock: Principal | ICD-10-CM

## 2020-10-13 DIAGNOSIS — L0390 Cellulitis, unspecified: Principal | ICD-10-CM

## 2020-10-13 MED ORDER — ONDANSETRON 4 MG DISINTEGRATING TABLET
ORAL_TABLET | Freq: Three times a day (TID) | ORAL | 0 refills | 4 days | Status: CP | PRN
Start: 2020-10-13 — End: 2020-10-20

## 2020-10-13 MED ORDER — IBUPROFEN 800 MG TABLET
ORAL_TABLET | Freq: Four times a day (QID) | ORAL | 0 refills | 5 days | Status: CP | PRN
Start: 2020-10-13 — End: ?

## 2020-10-13 MED ORDER — SULFAMETHOXAZOLE 800 MG-TRIMETHOPRIM 160 MG TABLET
ORAL_TABLET | Freq: Two times a day (BID) | ORAL | 0 refills | 10 days | Status: CP
Start: 2020-10-13 — End: 2020-10-23

## 2020-10-13 MED ORDER — HYDROCODONE 5 MG-ACETAMINOPHEN 325 MG TABLET
ORAL_TABLET | ORAL | 0 refills | 2 days | Status: CP | PRN
Start: 2020-10-13 — End: 2020-10-18

## 2020-10-15 DIAGNOSIS — L0291 Cutaneous abscess, unspecified: Principal | ICD-10-CM

## 2020-10-19 ENCOUNTER — Ambulatory Visit: Admit: 2020-10-19 | Discharge: 2020-10-20 | Payer: MEDICARE

## 2020-10-19 ENCOUNTER — Ambulatory Visit: Admit: 2020-10-19 | Discharge: 2020-10-19 | Disposition: A | Discharge status: Home / Self Care | Payer: MEDICARE

## 2020-10-19 DIAGNOSIS — L0591 Pilonidal cyst without abscess: Principal | ICD-10-CM

## 2020-10-19 DIAGNOSIS — L0291 Cutaneous abscess, unspecified: Principal | ICD-10-CM

## 2020-10-19 MED ORDER — IBUPROFEN 600 MG TABLET
ORAL_TABLET | Freq: Four times a day (QID) | ORAL | 2 refills | 15 days | Status: CP | PRN
Start: 2020-10-19 — End: 2021-10-19

## 2020-10-19 MED ORDER — DOXYCYCLINE HYCLATE 100 MG CAPSULE
ORAL_CAPSULE | Freq: Two times a day (BID) | ORAL | 0 refills | 10 days | Status: CP
Start: 2020-10-19 — End: 2020-10-29

## 2020-11-04 ENCOUNTER — Telehealth
Admit: 2020-11-04 | Discharge: 2020-11-05 | Payer: MEDICARE | Attending: Student in an Organized Health Care Education/Training Program | Primary: Student in an Organized Health Care Education/Training Program

## 2020-11-04 MED ORDER — ARIPIPRAZOLE 5 MG TABLET
ORAL_TABLET | Freq: Every day | ORAL | 2 refills | 30 days | Status: CP
Start: 2020-11-04 — End: 2021-02-02

## 2020-11-10 DIAGNOSIS — F3170 Bipolar disorder, currently in remission, most recent episode unspecified: Principal | ICD-10-CM

## 2020-11-10 MED ORDER — ARIPIPRAZOLE ER 400 MG INTRAMUSCULAR SUSPENSION,EXTENDED RELEASE
INTRAMUSCULAR | 10 refills | 28.00000 days | Status: CP
Start: 2020-11-10 — End: 2021-09-15
  Filled 2020-11-15: qty 1, 28d supply, fill #0

## 2020-11-11 NOTE — Unmapped (Signed)
[facility] Specialty Medication Onboarding    Specialty Medication: Abilify Maintena 400mg  injection  Prior Authorization: Not Required   Financial Assistance: No - copay  <$25  Final Copay/Day Supply: $0 / 28 days    Insurance Restrictions: None     Notes to Pharmacist: Per Rx comments: Patient will have medication delivered to clinic. Please message or call Jolene Schimke at [phone] to set up delivery.    The triage team has completed the benefits investigation and has determined that the patient is able to fill this medication at [facility]. Please contact the patient to complete the onboarding or follow up with the prescribing physician as needed.

## 2020-11-12 NOTE — Unmapped (Signed)
The patient declined counseling on medication administration, missed dose instructions, goals of therapy, side effects and monitoring parameters, warnings and precautions, drug/food interactions and storage, handling precautions, and disposal because they have taken the medication previously. The information in the declined sections below are for informational purposes only and was not discussed with patient.       [facility]   Patient Onboarding/Medication Counseling    Ashley Dunlap is a 30 y.o. female with bipolar disorder who I am counseling today on initiation of therapy.  I am speaking to the patient.    Was a Nurse, learning disability used for this call? No    Verified patient's date of birth / HIPAA.    Specialty medication(s) to be sent: General Specialty: [name]      Non-specialty medications/supplies to be sent: none      Medications not needed at this time: n/a         Abilify Maintena (aripiprazole)     Medication & Administration      Dosage: Inject the contents of 1 syringe (400mg ) into the muscle every 30 days     Administration: Inject slowly into the deltoid or gluteal muscle using the appropriate provided needle. Do not massage muscle after administration. Rotate injection sites.     Adherence/Missed dose instructions: If you miss your appointment please contact your provider as soon as possible..     Goals of Therapy      To treat bipolar disorder and schizophrenia     Side Effects & Monitoring Parameters   ?? Injection site irritation  ?? Fatigue, dizziness, feeling weak  ?? Restlessness  ?? Anxiety  ?? Headache  ?? Upset stomach or vomiting  ?? Weight gain  ?? Constipation   ?? Difficulty sleeping  ?? Change in appetite  ?? Dry mouth, nose or throat irritation, stuffy noses  ?? Drooling         The following side effects should be reported to the provider:  ?? Signs of an allergic reaction  ?? Signs of high blood sugar  ?? Trouble controlling body movements, twitching, change in balance, trouble swallowing or speaking  ?? Strong urges that are hard to control (gambling, sex, spending money)  ?? Seizures  ?? Vision changes  ?? Signs of NMS (fever, muscle cramps or stiffness, dizziness, severe headache, confusion, change in thinking, fast heartbeat, excessive sweating)  ?? Tardive dyskinesia (trouble controlling body movements or problems with your tongue, face, mouth or jaw)        Contraindications, Warnings, & Precautions      ?? Orthostatic hypotension  ?? Cardiovascular disease - use with caution in patient with pre-existing conditions  ?? Parkinson disease - use with caution; may aggravate motor disturbances  ?? Seizures  ?? Product interchangeability - there are 2 ER formulations available for IM administration.      Drug/Food Interactions      ?? Medication list reviewed in Epic. The patient was instructed to inform the care team before taking any new medications or supplements. No drug interactions identified.       Storage, Handling Precautions, & Disposal   ?? Store this medication at room temperature.        Current Medications (including OTC/herbals), Comorbidities and Allergies     Current Outpatient Medications   Medication Sig Dispense Refill   ??? ARIPiprazole (ABILIFY MAINTENA) 400 mg SERR injection Inject 2 mL (400 mg total) into the muscle every twenty-eight (28) days for 12 doses. 1 each  10   ??? ARIPiprazole (ABILIFY) 5 MG tablet Take 2 tablets (10 mg total) by mouth daily. 60 tablet 2   ??? clonazePAM (KLONOPIN) 0.5 MG tablet Take 1 tablet (0.5 mg total) by mouth nightly as needed (insomnia) for up to 10 doses. 10 tablet 0   ??? hydrOXYzine (ATARAX) 25 MG tablet Take 1 tablet (25 mg total) by mouth two (2) times a day as needed for anxiety. (Patient not taking: Reported on 10/19/2020) 60 tablet 1   ??? ibuprofen (MOTRIN) 600 MG tablet Take 1 tablet (600 mg total) by mouth every six (6) hours as needed for pain. 60 tablet 2   ??? multivitamin-min-iron-FA-vit K 45 mg iron- 800 mcg-120 mcg cap Take 1 tablet by mouth daily. [place]     ??? prenatal vit/iron fum/folic ac (PRENATAL VITAMIN WITH MINERALS ORAL) Take 1 tablet by mouth daily.     ??? traZODone (DESYREL) 50 MG tablet Take 1 tablet (50 mg) nightly. Can take 1 additional tablet (100 mg total) if needed for insomnia. (Patient not taking: Reported on 10/19/2020) 60 tablet 0     No current facility-administered medications for this visit.       Allergies   Allergen Reactions   ??? Oxycontin [Oxycodone] Hives   ??? Shellfish Containing Products Hives and Swelling       Patient Active Problem List   Diagnosis   ??? Legally blind   ??? Migraine without aura and with status migrainosus, not intractable   ??? Peripheral polyneuropathy   ??? Abnormal uterine bleeding   ??? PTSD (post-traumatic stress disorder)   ??? Alternating exotropia with V pattern   ??? Ankle instability   ??? Derangement of posterior horn of medial meniscus   ??? Horizontal nystagmus   ??? Myopia of both eyes   ??? Oculocutaneous albinism type II (OCA2) (CMS-HCC)   ??? Onychocryptosis   ??? Patellofemoral pain syndrome of both knees   ??? Plica syndrome   ??? Sensorineural hearing loss (SNHL) of left ear with unrestricted hearing of right ear   ??? Sickle cell trait (CMS-HCC)   ??? Type 2 diabetes mellitus without complication, without long-term current use of insulin (CMS-HCC)   ??? Acute pain of right knee   ??? Anxiety and depression   ??? Syncope and collapse   ??? Bipolar affective disorder, currently in remission (CMS-HCC)   ??? Constipation   ??? 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 Midline thoracic back pain   ??? Liver mass   ??? Hepatic adenoma   ??? S/P diagnostic laparoscopy 11-12-19   ??? Vaginal discharge   ??? Pregnancy   ??? Less than [redacted] weeks gestation of pregnancy       Reviewed and up to date in Epic.    Appropriateness of Therapy     Acute infections noted within Epic:  No active infections  Patient reported infection: None    Is medication and dose appropriate based on diagnosis and infection status? Yes    Prescription has been clinically reviewed: Yes      Baseline Quality of Life Assessment      How many days over the past month did your condition  keep you from your normal activities? For example, brushing your teeth or getting up in the morning. 0    Financial Information     Medication Assistance provided: None Required    Anticipated copay of $0 reviewed with patient. Verified delivery address.    Delivery Information     Scheduled delivery  date: 11/15/20    Expected start date: pending appt    Medication will be delivered via [facility] to the temporary address in [place].  This shipment will not require a signature.      Explained the services we provide at [facility] and that each month we would call to set up refills.  Stressed importance of returning phone calls so that we could ensure they receive their medications in time each month.  Informed patient that we should be setting up refills 7-10 days prior to when they will run out of medication.  A pharmacist will reach out to perform a clinical assessment periodically.  Informed patient that a welcome packet, containing information about our pharmacy and other support services, a Notice of Privacy Practices, and a drug information handout will be sent.      The patient or caregiver noted above participated in the development of this care plan and knows that they can request review of or adjustments to the care plan at any time.      Patient or caregiver verbalized understanding of the above information as well as how to contact the pharmacy at [phone] option [phone] with any questions/concerns.  The pharmacy is open Monday through Friday 8:30am-4:30pm.  A pharmacist is available 24/7 via pager to answer any clinical questions they may have.    Patient Specific Needs     - Does the patient have any physical, cognitive, or cultural barriers? No    - Does the patient have adequate living arrangements? (i.e. the ability to store and take their medication appropriately) Yes    - Did you identify any home environmental safety or security hazards? No    - Patient prefers to have medications discussed with  Patient     - Is the patient or caregiver able to read and understand education materials at a high school level or above? Yes    - Patient's primary language is  English     - Is the patient high risk? No    - Does the patient require physician intervention or other additional services (i.e. dietary/nutrition, smoking cessation, social work)? No      Arnold Long  [facility] Specialty Pharmacist

## 2020-11-12 NOTE — Unmapped (Signed)
This patient is receiving Abilify Maintena as a clinic administered medication. Patient is aware of copay of $0. The patient prefers all future communication to occur with the clinic. This patient is being disenrolled from our specialty management program and will be added to a patient list for appropriate follow up.

## 2020-11-15 ENCOUNTER — Institutional Professional Consult (permissible substitution): Admit: 2020-11-15 | Discharge: 2020-11-16 | Payer: MEDICARE

## 2020-11-15 DIAGNOSIS — F3170 Bipolar disorder, currently in remission, most recent episode unspecified: Principal | ICD-10-CM

## 2020-11-15 MED ADMIN — ARIPiprazole ER (ABILIFY MAINTENA) injection 400 mg: 400 mg | INTRAMUSCULAR | @ 17:00:00

## 2020-11-15 NOTE — Unmapped (Signed)
I was present and participated in the key portions of the service.  I reviewed the resident???s note.  I agree with the resident???s findings and plan. Iona Hansen, MD

## 2020-11-15 NOTE — Unmapped (Signed)
Patient came in today for injection only visit.  Vitals were within normal limits.  Patients medication was patient supplied.  See MAR for details.

## 2020-12-02 ENCOUNTER — Ambulatory Visit: Admit: 2020-12-02 | Discharge: 2020-12-03 | Payer: MEDICARE

## 2020-12-02 ENCOUNTER — Emergency Department
Admission: EM | Admit: 2020-12-02 | Discharge: 2020-12-02 | Disposition: A | Discharge status: Home / Self Care | Payer: 59 | Attending: Emergency Medicine | Admitting: Emergency Medicine

## 2020-12-02 ENCOUNTER — Encounter: Payer: Self-pay | Admitting: Emergency Medicine

## 2020-12-02 ENCOUNTER — Other Ambulatory Visit: Payer: Self-pay

## 2020-12-02 ENCOUNTER — Emergency Department: Payer: 59

## 2020-12-02 DIAGNOSIS — Z87891 Personal history of nicotine dependence: Secondary | ICD-10-CM | POA: Diagnosis not present

## 2020-12-02 DIAGNOSIS — R1030 Lower abdominal pain, unspecified: Secondary | ICD-10-CM

## 2020-12-02 DIAGNOSIS — E1142 Type 2 diabetes mellitus with diabetic polyneuropathy: Secondary | ICD-10-CM | POA: Insufficient documentation

## 2020-12-02 DIAGNOSIS — N390 Urinary tract infection, site not specified: Secondary | ICD-10-CM

## 2020-12-02 DIAGNOSIS — R102 Pelvic and perineal pain: Secondary | ICD-10-CM | POA: Diagnosis present

## 2020-12-02 DIAGNOSIS — Z90710 Acquired absence of both cervix and uterus: Secondary | ICD-10-CM

## 2020-12-02 LAB — URINALYSIS, ROUTINE W REFLEX MICROSCOPIC
Bacteria, UA: NONE SEEN
Bilirubin Urine: NEGATIVE
Glucose, UA: NEGATIVE mg/dL
Hgb urine dipstick: NEGATIVE
Ketones, ur: NEGATIVE mg/dL
Nitrite: NEGATIVE
Protein, ur: NEGATIVE mg/dL
Specific Gravity, Urine: 1.026 (ref 1.005–1.030)
pH: 5 (ref 5.0–8.0)

## 2020-12-02 LAB — WET PREP, GENITAL
Clue Cells Wet Prep HPF POC: NONE SEEN
Sperm: NONE SEEN
Trich, Wet Prep: NONE SEEN
Yeast Wet Prep HPF POC: NONE SEEN

## 2020-12-02 LAB — CBC WITH DIFFERENTIAL/PLATELET
Abs Immature Granulocytes: 0 10*3/uL (ref 0.00–0.07)
Basophils Absolute: 0 10*3/uL (ref 0.0–0.1)
Basophils Relative: 0 %
Eosinophils Absolute: 0 10*3/uL (ref 0.0–0.5)
Eosinophils Relative: 1 %
HCT: 35 % — ABNORMAL LOW (ref 36.0–46.0)
Hemoglobin: 10.8 g/dL — ABNORMAL LOW (ref 12.0–15.0)
Immature Granulocytes: 0 %
Lymphocytes Relative: 37 %
Lymphs Abs: 1.6 10*3/uL (ref 0.7–4.0)
MCH: 28.5 pg (ref 26.0–34.0)
MCHC: 30.9 g/dL (ref 30.0–36.0)
MCV: 92.3 fL (ref 80.0–100.0)
Monocytes Absolute: 0.2 10*3/uL (ref 0.1–1.0)
Monocytes Relative: 6 %
Neutro Abs: 2.4 10*3/uL (ref 1.7–7.7)
Neutrophils Relative %: 56 %
Platelets: 433 10*3/uL — ABNORMAL HIGH (ref 150–400)
RBC: 3.79 MIL/uL — ABNORMAL LOW (ref 3.87–5.11)
RDW: 15.3 % (ref 11.5–15.5)
WBC: 4.3 10*3/uL (ref 4.0–10.5)
nRBC: 0 % (ref 0.0–0.2)

## 2020-12-02 LAB — COMPREHENSIVE METABOLIC PANEL
ALT: 20 U/L (ref 0–44)
AST: 23 U/L (ref 15–41)
Albumin: 3.9 g/dL (ref 3.5–5.0)
Alkaline Phosphatase: 48 U/L (ref 38–126)
Anion gap: 8 (ref 5–15)
BUN: 12 mg/dL (ref 6–20)
CO2: 25 mmol/L (ref 22–32)
Calcium: 9 mg/dL (ref 8.9–10.3)
Chloride: 106 mmol/L (ref 98–111)
Creatinine, Ser: 0.64 mg/dL (ref 0.44–1.00)
GFR, Estimated: >60 mL/min (ref >60)
Glucose, Bld: 77 mg/dL (ref 70–99)
Potassium: 4.3 mmol/L (ref 3.5–5.1)
Sodium: 139 mmol/L (ref 135–145)
Total Bilirubin: 0.8 mg/dL (ref 0.3–1.2)
Total Protein: 7.4 g/dL (ref 6.5–8.1)

## 2020-12-02 LAB — CHLAMYDIA/NGC RT PCR (ARMC ONLY)
Chlamydia Tr: NOT DETECTED
N gonorrhoeae: NOT DETECTED

## 2020-12-02 LAB — LIPASE, BLOOD: Lipase: 33 U/L (ref 11–51)

## 2020-12-02 MED ORDER — LACTATED RINGERS IV BOLUS
1000.0000 mL | Freq: Once | INTRAVENOUS | Status: AC
  Administered 2020-12-02: 1000 mL via INTRAVENOUS

## 2020-12-02 MED ORDER — MORPHINE SULFATE (PF) 4 MG/ML IV SOLN
4.0000 mg | Freq: Once | INTRAVENOUS | Status: AC
  Administered 2020-12-02: 4 mg via INTRAVENOUS
  Filled 2020-12-02: qty 1

## 2020-12-02 MED ORDER — ONDANSETRON HCL 4 MG/2ML IJ SOLN
4.0000 mg | Freq: Once | INTRAMUSCULAR | Status: AC
  Administered 2020-12-02: 4 mg via INTRAVENOUS
  Filled 2020-12-02: qty 2

## 2020-12-02 MED ORDER — CEPHALEXIN 500 MG PO CAPS: 500.0000 mg | ORAL_CAPSULE | Freq: Two times a day (BID) | ORAL | 0 refills | Status: AC

## 2020-12-02 MED ORDER — IOHEXOL 350 MG/ML SOLN
75.0000 mL | Freq: Once | INTRAVENOUS | Status: AC | PRN
  Administered 2020-12-02: 75 mL via INTRAVENOUS
  Filled 2020-12-02: qty 75

## 2020-12-02 NOTE — ED Provider Notes (Signed)
[facility] Emergency Department Provider Note   ____________________________________________   Event Date/Time   First MD Initiated Contact with Patient 12/02/20 1131     (approximate)  I have reviewed the triage vital signs and the nursing notes.   HISTORY  Chief Complaint Vaginal Discharge and Pelvic Pain    HPI Kristen Griffith is a 30 y.o. female with past medical history of albinism, bipolar disorder, migraines, diabetes, sickle cell trait, and gastric bypass who presents to the ED complaining of abdominal pain.  Patient reports that she has had approximately 1 week of sharp pain in her lower abdomen.  She states it affects both lower quadrants as well as her pelvic area.  She is status post total hysterectomy on September 14 at [facility], had a follow-up appointment with her OB/GYN last week that was unremarkable.  She states pain has remained present since then, is not getting any better or worse.  She has noticed some brownish vaginal discharge at times, denies any bleeding.  She has not had any fevers, nausea, vomiting, or changes in bowel movements.  She does report some dysuria but denies any flank pain.  She reports speaking with her OB/GYN's office, who recommended she seek care in the ED.        Past Medical History:  Diagnosis Date   Albinism ([place])    Diabetes mellitus without complication ([place])    Sickle cell trait ([place])    Visual impairment     Patient Active Problem List   Diagnosis Date Noted   Uterine contractions during pregnancy 07/13/2020   Vitamin B12 deficiency 07/13/2020   Hepatic lesion 07/12/2020   Nausea and vomiting in pregnancy 06/23/2020   Request for sterilization 05/20/2020   Vasovagal syncope 05/20/2020   Palpitation 05/13/2020   Iron deficiency anemia during pregnancy 04/16/2020   Headache in pregnancy, antepartum 02/05/2020   Depression complicating pregnancy, antepartum 02/05/2020   Hepatic adenoma  08/15/2019   S/P gastric bypass 10/31/2018   Bipolar affective disorder, currently in remission ([place]) 07/19/2018   Derangement of posterior horn of medial meniscus 04/15/2018   Myopia of both eyes 12/14/2017   Migraine without aura and with status migrainosus, not intractable 08/27/2017   Legally blind 06/14/2016   GERD (gastroesophageal reflux disease) 11/15/2015   Peripheral polyneuropathy 10/22/2015   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   Anxiety and depression 09/11/2013   Onychocryptosis 05/28/2012   Albinism ([place]) 05/05/2011   Type 2 diabetes mellitus without complication, without long-term current use of insulin ([place]) 05/05/2011   Oculocutaneous albinism type II (OCA2) ([place]) 05/04/2011   Sensorineural hearing loss (SNHL) of left ear with unrestricted hearing of right ear 05/04/2011   Severe vision impairment of both eyes 05/04/2011   Sickle cell trait ([place]) 05/04/2011    Past Surgical History:  Procedure Laterality Date   BARIATRIC SURGERY  2020    Prior to Admission medications   Medication Sig Start Date End Date Taking? Authorizing Provider  cephALEXin (KEFLEX) 500 MG capsule Take 1 capsule (500 mg total) by mouth 2 (two) times daily for 7 days. 12/02/20 12/09/20 Yes Blake Divine, MD  Doxylamine-Pyridoxine 10-10 MG TBEC Take by mouth.    [provider]  famotidine (PEPCID) 20 MG tablet Take 20 mg by mouth 2 (two) times daily.    [provider]  folic acid (FOLVITE) 427 MCG tablet Take 400 mcg by mouth daily.    [provider]  metolazone (ZAROXOLYN)  10 MG tablet Take 10 mg by mouth daily.    [provider]  ondansetron (ZOFRAN) 8 MG tablet Take by mouth every 8 (eight) hours as needed for nausea or vomiting.    [provider]  pantoprazole (PROTONIX) 40 MG tablet Take 40 mg by mouth daily.    [provider]  Prenatal Vit-Fe Fumarate-FA (MULTIVITAMIN-PRENATAL) 27-0.8  MG TABS tablet Take 1 tablet by mouth daily at 12 noon.    [provider]  promethazine (PHENERGAN) 25 MG tablet Take 25 mg by mouth every 6 (six) hours as needed for nausea or vomiting.    [provider]  sucralfate (CARAFATE) 1 GM/10ML suspension Take 1 g by mouth 4 (four) times daily -  with meals and at bedtime.    [provider]  vitamin B-12 (CYANOCOBALAMIN) 500 MCG tablet Take 500 mcg by mouth daily.    [provider]    Allergies Oxycodone and Shellfish allergy  Family History  Problem Relation Age of Onset   Bipolar disorder Father    Schizophrenia Father    Personality disorder Father    Endometriosis Mother    Endometriosis Maternal Grandmother    Diabetes Maternal Grandmother    Diabetes Maternal Grandfather    Glaucoma Maternal Grandfather    Stroke Maternal Grandfather    Sickle cell anemia Sister    High blood pressure Sister     Social History Social History   Tobacco Use   Smoking status: Former   Smokeless tobacco: Never  Substance Use Topics   Alcohol use: Not Currently   Drug use: Never    Review of Systems  Constitutional: No fever/chills Eyes: No visual changes. ENT: No sore throat. Cardiovascular: Denies chest pain. Respiratory: Denies shortness of breath. Gastrointestinal: Positive for pelvic and abdominal pain.  No nausea, no vomiting.  No diarrhea.  No constipation. Genitourinary: Positive for dysuria.  Positive for vaginal discharge. Musculoskeletal: Negative for back pain. Skin: Negative for rash. Neurological: Negative for headaches, focal weakness or numbness.  ____________________________________________   PHYSICAL EXAM:  VITAL SIGNS: ED Triage Vitals  Enc Vitals Group     BP 12/02/20 1041 117/85     Pulse Rate 12/02/20 1041 84     Resp 12/02/20 1041 15     Temp 12/02/20 1041 98.6 F (37 C)     Temp Source 12/02/20 1041 Oral     SpO2 12/02/20 1041 97 %     Weight 12/02/20 1042 175 lb  (79.4 kg)     Height 12/02/20 1042 5\' 9"  (1.753 m)     Head Circumference --      [place] Flow --      Pain Score 12/02/20 1042 8     Pain Loc --      Pain Edu? --      Excl. in [place]? --     Constitutional: Alert and oriented. Eyes: Conjunctivae are normal. Head: Atraumatic. Nose: No congestion/rhinnorhea. Mouth/Throat: Mucous membranes are moist. Neck: Normal ROM Cardiovascular: Normal rate, regular rhythm. Grossly normal heart sounds.  2+ radial pulses bilaterally. Respiratory: Normal respiratory effort.  No retractions. Lungs CTAB. Gastrointestinal: Soft and tender to palpation in the bilateral lower quadrants as well as suprapubic area with no rebound or guarding. No distention. Genitourinary: Vaginal cuff intact with sutures in place, no bleeding or discharge noted. Musculoskeletal: No lower extremity tenderness nor edema. Neurologic:  Normal speech and language. No gross focal neurologic deficits are appreciated. Skin:  Skin is warm, dry and  intact. No rash noted. Psychiatric: Mood and affect are normal. Speech and behavior are normal.  ____________________________________________   LABS (all labs ordered are listed, but only abnormal results are displayed)  Labs Reviewed  WET PREP, GENITAL - Abnormal; Notable for the following components:      Result Value   WBC, Wet Prep HPF POC MODERATE (*)    All other components within normal limits  URINALYSIS, ROUTINE W REFLEX MICROSCOPIC - Abnormal; Notable for the following components:   Color, Urine YELLOW (*)    APPearance HAZY (*)    Leukocytes,Ua MODERATE (*)    All other components within normal limits  CBC WITH DIFFERENTIAL/PLATELET - Abnormal; Notable for the following components:   RBC 3.79 (*)    Hemoglobin 10.8 (*)    HCT 35.0 (*)    Platelets 433 (*)    All other components within normal limits  CHLAMYDIA/NGC RT PCR ([facility] ONLY)            URINE CULTURE  COMPREHENSIVE METABOLIC PANEL  LIPASE, BLOOD      PROCEDURES  Procedure(s) performed (including Critical Care):  Procedures   ____________________________________________   INITIAL IMPRESSION / ASSESSMENT AND PLAN / ED COURSE      30 year old female with past medical history of albinism, migraines, gastric bypass, diabetes, bipolar disorder, and sickle cell trait who presents to the ED with 1 week of ongoing bilateral lower quadrant and suprapubic abdominal pain associated with brownish vaginal discharge.  Pelvic exam is reassuring with cuff intact, no discharge or bleeding noted.  She does have tenderness in her bilateral lower quadrants as well as her suprapubic area, UA shows possible infection which could be the source of her symptoms, but we will further assess with labs and CT scan.  We will treat symptomatically with IV morphine and Zofran, hydrate with IV fluids.  CT scan is unremarkable, no evidence of abscess or other complication related to her recent hysterectomy.  Given reassuring work-up, suspect patient's symptoms are related to UTI and she is appropriate for discharge home with course of antibiotics.  She was counseled to follow-up with her OB/GYN at [facility] and to return to the ED for new or worsening symptoms.  Patient agrees with plan.      ____________________________________________   FINAL CLINICAL IMPRESSION(S) / ED DIAGNOSES  Final diagnoses:  Lower abdominal pain  H/O vaginal hysterectomy  Lower urinary tract infectious disease     ED Discharge Orders          Ordered    cephALEXin (KEFLEX) 500 MG capsule  2 times daily        12/02/20 1424             Note:  This document was prepared using Dragon voice recognition software and may include unintentional dictation errors.    Blake Divine, MD 12/02/20 1425

## 2020-12-02 NOTE — ED Triage Notes (Signed)
Pt in via POV, reports constant pelvic and vaginal pain x approximately one week.  Pt reports having recent partial hysterectomy on 9/14; follow up w/ OB unremarkable.  Using Tylenol to treat the pain without any relief.    Further into triage, also reports foul smelling discharge.    Vitals WDL, NAD noted at this time.

## 2020-12-03 DIAGNOSIS — E70321 Tyrosinase positive oculocutaneous albinism: Principal | ICD-10-CM

## 2020-12-03 DIAGNOSIS — S93401A Sprain of unspecified ligament of right ankle, initial encounter: Principal | ICD-10-CM

## 2020-12-03 LAB — URINE CULTURE

## 2020-12-03 NOTE — Unmapped (Signed)
Pt informed due for retinal photos. Would like to postpone urine micro-albumin for later date.

## 2020-12-03 NOTE — Unmapped (Unsigned)
Assessment/Plan: Ashley Dunlap is a 30 y.o. female presenting for routine well woman exam    Ashley Dunlap was seen today for annual exam.    Diagnoses and all orders for this visit:    SOB (shortness of breath)  Lungs clear but will do formal testing to r/o asthma  -     Spirometry Pre/Post Bronchodilator; Future    Oculocutaneous albinism type II (OCA2) (CMS-HCC)  -     Ambulatory referral to Home Health; Future    Sickle cell trait (CMS-HCC)  Stable at this time no med changes    Type 2 diabetes mellitus without complication, without long-term current use of insulin (CMS-HCC)  A1c has normalized. Reasonable to think DM cured at this piong  -     POCT Glycosylated Hemoglobin (HGB A1c)    Legally blind  - Wrote letter  -     Ambulatory referral to Home Health; Future    Sprain of right ankle, unspecified ligament, initial encounter  Seems ike a high sprain, discussed a boot, but pt states that is too difficult for her with a 54 month old. She plans to use ace wrap and her own high boot (non-medical). Will get her back in with sports med in a week to reassess.     Other orders  -     INFLUENZA VACCINE (QUAD) IM - 6 MO-ADULT - PF  -     Cancel: Pneumococcal polysaccharide vaccine 23-valent greater than or equal to 2yo subcutaneous/IM  -     COVID-19 VAC,IM,BV(47YR UP)BOOST,PFIZER         RETURN TO CARE:  Future Appointments   Date Time Provider Department Center   12/16/2020  3:30 PM Georgina Snell, MD [facility]         Subjective:    HPI: Ashley Dunlap is a 31 y.o. female who is here for an annual well woman exam.     H/o DM , but s/p barriatric surgery so no longer taking medication  States she got retinal screen at [facility] optho    Legally blind  Needs a letter for accomodation also asks about getting home health.    Depression  In the [facility] mood clinic     Ankle pain  Sprained ankle the other day. Still hurting. Has been taking tylenol, ibuprofen. Has had surgery on her ankle in the past.     SOB when heavy physical activity, such as lifting baby, carseat, Or climbing a lot of stairs. Going on for the last month though reports this happens this time of year every year. FH of asthma. Occ CP with this.  Stable over this time period. Denies coughing    RX/ ALLERGIES/ MED HX/ SURG HX/ SOC HX/ FAM HX: Reviewed & updated.    ROS: A balance of 10-system review of systems was negative.    Objective:   PHYSICAL EXAM: BP 107/82 (BP Site: L Arm, BP Position: Sitting, BP Cuff Size: Large)  - Pulse 89  - Temp 36.7 ??C (98 ??F) (Temporal)  - Ht 173.1 cm (5' 8.15)  - Wt 82.1 kg (181 lb)  - BMI 27.40 kg/m??    Wt Readings from Last 3 Encounters:   12/02/20 82.1 kg (181 lb)   11/15/20 81.6 kg (180 lb)   10/19/20 82.3 kg (181 lb 6.4 oz)     Gen: WN/ WA, NAD.   Head: normocephalic, atraumatic, MMM.  Neck: supple, full ROM.  Heart: RRR, no M/R/G.  Lungs: CTAB, no wheezes.  Extremities: Mild swelling of R ankle. Tenderness throughout the lateral aspect and with calf squeeze  Skin: warm, well perfused, no rashes.   Psychiatry: A&O to person, place, and time.       PHQ-2 Score: 2    PHQ-9 Score: 12    Edinburgh Score:      Screening complete, depression identified / today's follow-up action documented in note    LABS/ STUDIES:   Results for orders placed or performed in visit on 12/02/20   POCT Glycosylated Hemoglobin (HGB A1c)   Result Value Ref Range    HGB A1C, POC 5.3 <7.0 %    EST AVERAGE GLUCOSE, POC 105 mg/dL Collection   Result Value Ref Range    Aerobic/Anaerobic Culture 1+ Methicillin-Susceptible Staphylococcus aureus (A)     Gram Stain Result No polymorphonuclear leukocytes seen     Gram Stain Result No organisms seen        Susceptibility    Methicillin-Susceptible Staphylococcus aureus - MIC SUSCEPTIBILITY RESULT     Vancomycin 1 Susceptible     Methicillin-Susceptible Staphylococcus aureus - KIRBY BAUER     Nafcillin*  Susceptible       * For predictive information: http://www.[facility].org/[place]/professional-education-services/[facility]-[facility]/available-tests/culture-predictive-information-for-staphylococci-resi/     Erythromycin  Resistant      Clindamycin*  Resistant       * This isolate is presumed to be resistant based on detection of  inducible clindamycin resistance.     Doxycycline  Susceptible      Gentamicin*  Susceptible       * Gentamicin is used only in combination with other active agents that test susceptible     Trimethoprim + Sulfamethoxazole  Susceptible      Fluoroquinolone*  No Interpretation       * Fluoroquinolones are not indicated for the treatment of staphylococcal infections, including MRSA.    CBC w/ Differential   Result Value Ref Range    WBC 5.7 3.6 - 11.2 10*9/L    RBC 3.17 (L) 3.95 - 5.13 10*12/L    HGB 9.5 (L) 11.3 - 14.9 g/dL    HCT 66.0 (L) 63.0 - 44.0 %    MCV 90.3 77.6 - 95.7 fL    MCH 29.9 25.9 - 32.4 pg    MCHC 33.1 32.0 - 36.0 g/dL    RDW 16.0 10.9 - 32.3 %    MPV 6.3 (L) 6.8 - 10.7 fL    Platelet 302 150 - 450 10*9/L    Neutrophils % 66.7 %    Lymphocytes % 22.6 %    Monocytes % 9.4 %    Eosinophils % 0.7 %    Basophils % 0.6 %    Absolute Neutrophils 3.8 1.8 - 7.8 10*9/L    Absolute Lymphocytes 1.3 1.1 - 3.6 10*9/L    Absolute Monocytes 0.5 0.3 - 0.8 10*9/L    Absolute Eosinophils 0.0 0.0 - 0.5 10*9/L    Absolute Basophils 0.0 0.0 - 0.1 10*9/L

## 2020-12-07 ENCOUNTER — Ambulatory Visit
Admit: 2020-12-07 | Discharge: 2020-12-08 | Payer: MEDICARE | Attending: Student in an Organized Health Care Education/Training Program | Primary: Student in an Organized Health Care Education/Training Program

## 2020-12-08 NOTE — Unmapped (Signed)
Filled out paperwork to support pt obtain a two bedroom apt

## 2020-12-08 NOTE — Unmapped (Signed)
[facility] Administered Medication Refill Coordination Note      NAME:Tanajah Nazifa Trinka DOB: [birth year]      Medication: Abilify Maintena  Day Supply: 28 days      SHIPPING      Next delivery from [facility] [phone]) to [facility] for Kathrina Careena Degraffenreid is scheduled for 10/18.    Clinic contact: Jolene Schimke     Patient's next nurse visit for administration: 10/20.    We will follow up with clinic monthly for standard refill processing and delivery.      Alinah Sheard Samella Parr  Specialty Pharmacy Technician

## 2020-12-08 NOTE — Unmapped (Signed)
[facility]  Established Patient Clinic Note    Assessment/Plan:   Ashley Dunlap is a 30 y.o.female    Problem List Items Addressed This Visit        Other    Legally blind ([place])     Filled out paperwork to support pt obtain a two bedroom apt            Other Visit Diagnoses     Anemia, unspecified type    -  Primary    Relevant Orders    CBC    Iron Panel    Vitamin B12           Attending: Dr. Laurance Flatten    Subjective   Ashley Dunlap is a 30 y.o. female  coming to clinic today for the following issues:    Chief Complaint   Patient presents with   ??? Follow-up     Paper work and discuss iron levels     HPI:    ?? Pt here to have paperwork filled out to advocate for her to have a two bedroom apt in order to have room for herself, child, and tools needed for her ADLs. The tools include:Table Magnifier, hospital bed, shower chair and Rolator, cane for blindness.   ?? Pt has a hx of anemia and hasn't had updated labs. She notes significant fatigue similar to previous episode of low iron that required an infusion. Denies syncope and active bleeding.     I have reviewed the problem list, medications, and allergies and have updated/reconciled them if needed.    Ashley Dunlap  reports that she quit smoking about 4 years ago. Her smoking use included cigarettes. She has a 0.50 pack-year smoking history. She has never used smokeless tobacco.  Health Maintenance   Topic Date Due   ??? Pneumococcal Vaccine 0-64 (2 - PCV) 12/08/2016   ??? Retinal Eye Exam  12/12/2019   ??? Urine Albumin/Creatinine Ratio  11/25/2020   ??? Hemoglobin A1c  06/02/2021   ??? Serum Creatinine Monitoring  10/22/2021   ??? Potassium Monitoring  10/22/2021   ??? Foot Exam  12/02/2021   ??? DTaP/Tdap/Td Vaccines (9 - Td or Tdap) 05/22/2030   ??? Hepatitis C Screen  Completed   ??? COVID-19 Vaccine  Completed   ??? Influenza Vaccine  Completed       Objective     VITALS: BP 112/79 (BP Site: L Arm, BP Position: Sitting, BP Cuff Size: Large)  - Pulse 81  - Temp 36.2 ??C (97.2 ??F) (Temporal)  - Ht 173.1 cm (5' 8.15)  - Wt 83.5 kg (184 lb)  - BMI 27.85 kg/m??     Physical Exam  Constitutional:       Appearance: Normal appearance. She is normal weight.   HENT:      Head: Normocephalic and atraumatic.   Cardiovascular:      Rate and Rhythm: Normal rate and regular rhythm.      Pulses: Normal pulses.      Heart sounds: Normal heart sounds.   Pulmonary:      Effort: Pulmonary effort is normal.      Breath sounds: Normal breath sounds.   Abdominal:      General: Bowel sounds are normal.      Palpations: Abdomen is soft.   Skin:     General: Skin is warm.      Capillary Refill: Capillary refill takes less than 2 seconds.   Neurological:  Mental Status: She is alert.   Psychiatric:         Mood and Affect: Mood normal.         Behavior: Behavior normal.         LABS/IMAGING  I have reviewed pertinent recent labs and imaging in Epic    [facility]  [place] [facility] [place] at [facility]  [address] [place], [place] [address] ??? Telephone [phone] ??? Fax [phone]  [email or web address]

## 2020-12-08 NOTE — Unmapped (Signed)
I saw and evaluated the patient, participating in the key portions of the service.  I reviewed the resident???s note.  I agree with the resident???s findings and plan. Soyla Murphy, MD

## 2020-12-13 NOTE — Unmapped (Unsigned)
E-Visit  This medical encounter was conducted virtually using [email or web address]  TeleHealth protocols.      Patient is currently located in the state of [place].  I have identified myself to the patient and conveyed my credentials to Ashley Dunlap  The capabilities and limitations of telemedicine have been explained to the patient and both agree that it is appropriate for their current circumstances/symptoms.  Patient has signed informed consent on file in medical record.      Subjective:      Ashley Dunlap is a 30 y.o. female who presents  for a e-visit with   Chief Complaint   Patient presents with   ??? Ear Problem     Entered automatically based on patient selection in MyChart.       See E-Visit Questionnaire answers.    HISTORY:  I have reviewed the patient's problem list, current medications, and allergies and have updated/reconciled them as needed.    Assessment and Plan:     Problem List Items Addressed This Visit    None         RETURN TO CARE: Ashley Karl Ito, FNP  [address]  [place] [address]    Visit conducted via E-Visit.    Total time spent on this E-Visit:

## 2020-12-14 DIAGNOSIS — J029 Acute pharyngitis, unspecified: Principal | ICD-10-CM

## 2020-12-14 DIAGNOSIS — R519 Nonintractable headache, unspecified chronicity pattern, unspecified headache type: Principal | ICD-10-CM

## 2020-12-14 DIAGNOSIS — R5383 Other fatigue: Principal | ICD-10-CM

## 2020-12-14 MED FILL — ABILIFY MAINTENA 400 MG INTRAMUSCULAR SUSPENSION,EXTENDED RELEASE: INTRAMUSCULAR | 28 days supply | Qty: 1 | Fill #1

## 2020-12-14 NOTE — Unmapped (Signed)
E-Visit  This medical encounter was conducted virtually using [email or web address]  TeleHealth protocols.      Patient is currently located in the state of [place].  I have identified myself to the patient and conveyed my credentials to Ms. Palmatier  The capabilities and limitations of telemedicine have been explained to the patient and both agree that it is appropriate for their current circumstances/symptoms.  Patient has signed informed consent on file in medical record.      Subjective:      Ashley Dunlap is a 30 y.o. female who presents  for a e-visit with   Chief Complaint   Patient presents with   ??? Ear Problem     Entered automatically based on patient selection in MyChart.       See E-Visit Questionnaire answers.    HISTORY:  I have reviewed the patient's problem list, current medications, and allergies and have updated/reconciled them as needed.    Assessment and Plan:     Problem List Items Addressed This Visit        Unprioritized    Otalgia of both ears     Likely viral  In origin with post nasal drainage causing sore throat.  Recommend  Ibuprofen for pain, antihistamine to dry our drainage, salt water gargles and lozenges for sore throat. Monitor symptoms and if worsen seek care.                RETURN TO CARE: Petra Kuba, MD  [address]  [place] [address]    Visit conducted via E-Visit.    Total time spent on this E-Visit:10

## 2020-12-14 NOTE — Unmapped (Signed)
[facility] ENT Encounter  This medical encounter was conducted virtually using [email or web address]  [email or web address] protocols.    Patient ID: Ashley Dunlap is a 30 y.o. female who presents by video interaction for evaluation.    I have identified myself to the patient and conveyed my credentials to [employer].   Patient has signed informed consent on file in medical record.    Present on Video Call: Is there someone else in the room? No..    Assessment/Plan:    Diagnoses and all orders for this visit:    Fatigue, unspecified type    Nonintractable headache, unspecified chronicity pattern, unspecified headache type    Sore throat     Pt to go in person for an evaluation for marked fatigue with anemia history.  May need labs and COVID/Flu testing potentially.    -- Discussed the new prescription noted above, including potential side effects, drug interactions, instructions for taking the medication, and the consequences of not taking it.  -- Patient verbalized an understanding of today's assessment and recommendations, as well as the purpose of ongoing medications.    Medication adherence and barriers to the treatment plan have been addressed. Opportunities to optimize healthy behaviors have been discussed. Patient / caregiver voiced understanding.        Subjective:     HPI  Ashley Dunlap is 30 y.o. and presents today in the [facility] with ENT symptoms.  The PCP for this patient is Petra Kuba, MD. Pt with marked fatigue.  Having trouble staying awake today.  C/o sore throat and headache as well.  No known fever.  Had COVID last month per patient.  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         ROS  Review of Systems     All other ROS per HPI.    I have reviewed the problem list, past medical history, past family history, medications, and allergies and have updated/reconciled them if needed.          Objective:   Physical Exam  As part of this Video Visit, no in-person exam was conducted.  Video interaction permitted the following observations.    General: No acute distress.   HEENT: OP clear.  Uvula midline.  No pain on provider guided self-palpation/manipulation of external ear.  No visible mass or abnormality of neck.   RESP: Relaxed respiratory effort. No conversational dyspnea.   SKIN: No rashes noted.  NEURO: Normal coordination.  No tremors observed.  PSYCH: Alert and oriented.  Speech fluent and sensible.  Calm affect.             The patient reports they are currently: at home. I spent 6 minutes on the real-time audio and video with the patient on the date of service. I spent an additional 7 minutes on pre- and post-visit activities on the date of service.     The patient was physically located in [place] or a state in which I am permitted to provide care. The patient and/or parent/guardian understood that s/he may incur co-pays and cost sharing, and agreed to the telemedicine visit. The visit was reasonable and appropriate under the circumstances given the patient's presentation at the time.    The patient and/or parent/guardian has been advised of the potential risks and limitations of this mode of treatment (including, but not limited to, the absence of in-person examination) and  has agreed to be treated using telemedicine. The patient's/patient's family's questions regarding telemedicine have been answered.     If the visit was completed in an ambulatory setting, the patient and/or parent/guardian has also been advised to contact their provider???s office for worsening conditions, and seek emergency medical treatment and/or call 911 if the patient deems either necessary.

## 2020-12-14 NOTE — Unmapped (Signed)
Likely viral  In origin with post nasal drainage causing sore throat.  Recommend  Ibuprofen for pain, antihistamine to dry our drainage, salt water gargles and lozenges for sore throat. Monitor symptoms and if worsen seek care.

## 2020-12-16 ENCOUNTER — Ambulatory Visit
Admit: 2020-12-16 | Discharge: 2020-12-17 | Payer: MEDICARE | Attending: Student in an Organized Health Care Education/Training Program | Primary: Student in an Organized Health Care Education/Training Program

## 2020-12-16 MED ORDER — BUPROPION HCL XL 150 MG 24 HR TABLET, EXTENDED RELEASE
ORAL_TABLET | Freq: Every day | ORAL | 0 refills | 90.00000 days | Status: CP
Start: 2020-12-16 — End: 2021-03-16

## 2020-12-16 MED ADMIN — ARIPiprazole ER (ABILIFY MAINTENA) injection 400 mg: 400 mg | INTRAMUSCULAR | @ 20:00:00

## 2020-12-16 NOTE — Unmapped (Signed)
[facility]  Psychiatry   Established Patient E&M Service - Outpatient       Assessment:    Ashley Dunlap presents for follow-up evaluation, four months post-partum. Patient has been able to receive two doses of LAI Maintena and reports improvement in anxiety and mood stability. She desires a medication to help with focus and/or low mood, which are her main concerns at this time. Plan is to initiate Wellbutrin while continuing antipsychotic LAI with the intention of targeting both of patient's concerns. May consider transfer back to [facility] soon. RTC in 2 months or sooner prn.       Identifying Information:  Ashley Dunlap is a 30 y.o. G56P1021 female gave birth on 08/11/20 with a history of PTSD and bipolar II disorder. On initial evaluation, she met criteria for PTSD??with recurrent dreams and flashbacks, increased anxiety, impulsivity, problems with concentration, and sleep disturbance. She also appeared to meet criteria for bipolar II, with a history of episodes of elevated mood/anxiety, lack of sleep, distractibility, hypersexuality, and irritability consistent with hypomania, followed by episodes of depression. 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 Patient reported no history of suicide attempts on initial evaluation, although chart review from [facility] notes four prior attempts via overdose, with the last in 02/2016 during which patient took an overdose of sleeping pills, although she was not psychiatrically hospitalized at that time or previously. 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    Med trials: mirtazapine, hydroxyzine, Ativan, Lamictal, Prozac, prazosin  ??  Risk Assessment:  An assessment of suicide and violence risk factors was performed as part of this evaluation and is not significantly changed from the last visit.   While future psychiatric events cannot be accurately predicted, the patient does not currently require acute inpatient psychiatric care and does not currently meet [facility] involuntary commitment criteria.      Plan:    Problem: Bipolar II disorder  Status of problem: chronic and stable  Interventions:   -- continue LAI Abilify Maintena 400mg  q28d  -- START Wellbutrin XL 150mg  daily   -- metabolic monitoring previously ordered - will obtain at next [facility] appointment  -- pt is currently contemplative regarding therapy, encouraged her to utilize [email or web address]. Pt plans to ask around her community.     Problem: reported ADHD  Status of problem: new  Interventions:  -- Wellbutrin as above    Metabolic Monitoring:  Initial Weight:    Last Weight:    Last BMI: Body mass index is 27.64 kg/m??.  Admit BP:    Last BP:    Lipid Panel:   Lab Results   Component Value Date    Cholesterol 173 02/22/2018    LDL Calculated 109 02/22/2018    HDL 43 02/22/2018    Triglycerides 105 02/22/2018     Hemoglobin A1C:   Lab Results   Component Value Date    HGB A1C, POC 5.3 12/02/2020      Fasting Blood Sugar: No results found for: GLUF    Continuity of care: ROI signed for [facility].      Psychotherapy provided:  No billable psychotherapy service provided.    Patient has been given this writer's contact information as well as the [facility] Psychiatry urgent line number. The patient has been instructed to call 911 for emergencies.    Patient and plan of care were discussed with the Attending MD,Dr. Lyla Glassing, who agrees with the above statement and  plan.      Subjective:    Interval History:   Since last visit, has found housing but working on paying for deposit. Is on disability. Housing through Section 8 program. Will have family support with baby even after moving out. Currently working on finding a job, interview on Monday. Since LAI, has noticed less irritability/anger. Reports she is generally not very adherent to oral medications.     Reports mood has been low. Has been struggling with father of the baby and what should be done for their child. Also reports noticing poor focus and concentration recently, which she attributes to her ADHD, reports was diagnosed a decade ago (around 30yo). Asks if there are any injections to address these symptoms. After some discussion, is open to trying Wellbutrin, which has not tried before, with goal of addressing mood concerns as well as inattention. Patient is open to taking an oral medication despite her preference.     After stepping out of the room and returning, patient founding standing/pacing the room, eager to leave the clinic. Reports sister has a nail appointment that she needs to get to.   ??   Objective:    Mental Status Exam:  Appearance:    Appears stated age   Motor:   No abnormal movements   Speech/Language:    Normal rate, volume, tone, fluency and Language intact, well formed   Mood:   less angry but depressed   Affect:   Cooperative, Decreased range and Mood congruent   Thought process and Associations:   Logical, linear, clear, coherent, goal directed   Abnormal/psychotic thought content:     Denies SI, HI, self harm, delusions, obsessions, paranoid ideation, or ideas of reference   Perceptual disturbances:     Denies auditory and visual hallucinations, behavior not concerning for response to internal stimuli     Other:          Visit was completed face to face.      Tommy Medal, MD

## 2020-12-16 NOTE — Unmapped (Signed)
Patient came in today for Office Visit and due for long acting injection.  Vitals were within normal limits.  Patients medication was patient supplied.  See MAR for details.

## 2020-12-20 NOTE — Unmapped (Signed)
I was present  and participated in the evaluation of the patient, including the key portions of the service.  I reviewed the resident???s note.  I agree with the resident???s findings and plan. Iona Hansen, MD

## 2021-01-05 NOTE — Unmapped (Signed)
[facility] Specialty Pharmacy Clinic Administered Medication Refill Coordination Note      NAME:Ashley Dunlap DOB: [birth year]      Medication: Abilify Maintena  Day Supply: 28 days      SHIPPING      Next delivery from [facility] [phone]) to [facility] for Ashley Dunlap is scheduled for 11/15.    Clinic contact: Jolene Schimke    Patient's next nurse visit for administration: 11/16.    We will follow up with clinic monthly for standard refill processing and delivery.      Akshar Starnes Samella Parr  Specialty Pharmacy Technician

## 2021-01-11 MED FILL — ABILIFY MAINTENA 400 MG INTRAMUSCULAR SUSPENSION,EXTENDED RELEASE: INTRAMUSCULAR | 28 days supply | Qty: 1 | Fill #2

## 2021-01-12 ENCOUNTER — Institutional Professional Consult (permissible substitution): Admit: 2021-01-12 | Discharge: 2021-01-13 | Payer: MEDICARE

## 2021-01-12 MED ADMIN — ARIPiprazole ER (ABILIFY MAINTENA) injection 400 mg: 400 mg | INTRAMUSCULAR | @ 15:00:00

## 2021-01-12 NOTE — Unmapped (Signed)
Patient came in today for injection only visit.  Vitals were within normal limits.  Patients medication was patient supplied.  See MAR for details. Next injection scheduled for 02/09/2021 @ 9am

## 2021-01-27 NOTE — Unmapped (Signed)
[facility] Specialty Pharmacy Clinic Administered Medication Refill Coordination Note      NAME:[name] DOB: [birth year]      Medication: Abilify Maintena  Day Supply: 28 days      SHIPPING      Next delivery from [facility] [phone]) to [facility] Adult PSYCH for Ashley Dunlap is scheduled for 12/07.    Clinic contact: Ilsa Iha -[place]     Patient's next nurse visit for administration: 12/14.    We will follow up with clinic monthly for standard refill processing and delivery.      Erian Lariviere Samella Parr  Specialty Pharmacy Technician

## 2021-02-02 MED FILL — ABILIFY MAINTENA 400 MG INTRAMUSCULAR SUSPENSION,EXTENDED RELEASE: INTRAMUSCULAR | 28 days supply | Qty: 1 | Fill #3

## 2021-02-03 ENCOUNTER — Ambulatory Visit
Admit: 2021-02-03 | Payer: MEDICARE | Attending: Rehabilitative and Restorative Service Providers" | Primary: Rehabilitative and Restorative Service Providers"

## 2021-02-03 NOTE — Unmapped (Signed)
[facility]  [address] [place], [place] [address]    [phone]    This note is to let you know that Sherlean Foot did not show for their scheduled Physical Therapy Evaluation. Patient canceled via MyChart - did not want appointment. Please contact me if you have any questions or concerns.    Thank you for this referral,     Signed: Signa Kell, PT  02/03/2021 10:08 AM

## 2021-02-17 ENCOUNTER — Telehealth
Admit: 2021-02-17 | Discharge: 2021-02-18 | Payer: MEDICARE | Attending: Student in an Organized Health Care Education/Training Program | Primary: Student in an Organized Health Care Education/Training Program

## 2021-02-17 MED ORDER — ARIPIPRAZOLE 10 MG TABLET
ORAL_TABLET | Freq: Every day | ORAL | 0 refills | 14 days | Status: CP | PRN
Start: 2021-02-17 — End: 2021-03-03
  Filled 2021-03-02: qty 1, 28d supply, fill #4

## 2021-02-17 MED ORDER — BUPROPION HCL XL 150 MG 24 HR TABLET, EXTENDED RELEASE
ORAL_TABLET | Freq: Every day | ORAL | 0 refills | 90 days | Status: CP
Start: 2021-02-17 — End: 2021-05-18

## 2021-02-17 NOTE — Unmapped (Signed)
Recent:   What is the date of your last related visit?  None  Related acute medications Rx'd:  na  Home treatment tried:  Motrin 600mg  12/21 2330    Relevant:   Allergies: Oxycontin [oxycodone] and Shellfish containing products  Medications: Abilify  Health History: PTSD, Bipolar, ADHD  Weight: 182lbs    [facility] patients only:  What was the date of your last cancer treatment (mm/dd/yy)?: na  Was the treatment oral or infusion?: na  Are you currently on TVEC (yes/no)?: na    Reason for Disposition  ??? Difficult to awaken or acting confused (e.g., disoriented, slurred speech)    Answer Assessment - Initial Assessment Questions  1. DESCRIPTION: Describe your dizziness.      Lightheaded/spinning  2. VERTIGO: Do you feel like either you or the room is spinning or tilting?       Room is spinning  3. LIGHTHEADED: Do you feel lightheaded? (e.g., somewhat faint, woozy, weak upon standing)      Lighthead, weak with standing  4. SEVERITY: How bad is it?  Can you walk?    - MILD: Feels unsteady but walking normally.    - MODERATE: Feels very unsteady when walking, but not falling; interferes with normal activities (e.g., school, work) .    - SEVERE: Unable to walk without falling, or requires assistance to walk without falling.      Moderate, really dizzy when walking  5. ONSET:  When did the dizziness begin?      12/19  6. AGGRAVATING FACTORS: Does anything make it worse? (e.g., standing, change in head position)      Standing  7. CAUSE: What do you think is causing the dizziness?      Unsure  8. RECURRENT SYMPTOM: Have you had dizziness before? If Yes, ask: When was the last time? What happened that time?      No  9. OTHER SYMPTOMS: Do you have any other symptoms? (e.g., headache, weakness, numbness, vomiting, earache)      Headache - 10/10 with no relief from Motrin, neck pain - can't touch chin to chest, nose bleed - resolved <21min, acting confused/difficulty thinking  10. PREGNANCY: Is there any chance you are pregnant? When was your last menstrual period?        No, LMP hysterectomy    Protocols used: DIZZINESS - VERTIGO-A-AH

## 2021-02-17 NOTE — Unmapped (Signed)
[facility]  Psychiatry   Established Patient E&M Service - Outpatient       Assessment:    Ashley Dunlap presents for follow-up evaluation. Patient reports she has been doing well. Wellbutrin has been most helpful with focus and mildly mood. While patient is happy with abilify maintenna LAI, notes that she has been having some grogginess after her doses. However, this may be attributed to patient's recently diagnosed anemia requiring iron transfusion. Will continue at current dose for now and continue to monitor. No medication changes today. Plan to transfer back to [facility] soon. RTC in 2 months or sooner prn.       Identifying Information:  Ashley Dunlap is a 30 y.o. G7P1021 female gave birth on 08/11/20 with a history of PTSD and bipolar II disorder. On initial evaluation, she met criteria for PTSD??with recurrent dreams and flashbacks, increased anxiety, impulsivity, problems with concentration, and sleep disturbance. She also appeared to meet criteria for bipolar II, with a history of episodes of elevated mood/anxiety, lack of sleep, distractibility, hypersexuality, and irritability consistent with hypomania, followed by episodes of depression. 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 Patient reported no history of suicide attempts on initial evaluation, although chart review from [facility] notes four prior attempts via overdose, with the last in 02/2016 during which patient took an overdose of sleeping pills, although she was not psychiatrically hospitalized at that time or previously. 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    Med trials: mirtazapine, hydroxyzine, Ativan, Lamictal, Prozac, prazosin  ??  Risk Assessment:  An assessment of suicide and violence risk factors was performed as part of this evaluation and is not significantly changed from the last visit.   While future psychiatric events cannot be accurately predicted, the patient does not currently require acute inpatient psychiatric care and does not currently meet [facility] involuntary commitment criteria.      Plan:    Problem: Bipolar II disorder  Status of problem: chronic and stable  Interventions:   -- continue LAI Abilify Maintena 400mg  q28d  -- continue Wellbutrin XL 150mg  daily   -- Lipid panel previously ordered - plan to obtain at next [facility] appointment  -- pt is currently contemplative regarding therapy, encouraged her to utilize [email or web address]. Pt plans to ask around her community.     Problem: reported ADHD  Status of problem: new  Interventions:  -- Wellbutrin as above    Metabolic Monitoring:  Initial Weight:    Last Weight:    Last BMI: There is no height or weight on file to calculate BMI.  Admit BP:    Last BP:    Lipid Panel:   Lab Results   Component Value Date    Cholesterol 173 02/22/2018    LDL Calculated 109 02/22/2018    HDL 43 02/22/2018    Triglycerides 105 02/22/2018     Hemoglobin A1C:   Lab Results   Component Value Date    HGB A1C, POC 5.3 12/02/2020      Fasting Blood Sugar: No results found for: GLUF    Continuity of care: ROI signed for [facility].      Psychotherapy provided:  No billable psychotherapy service provided.    Patient has been given this writer's contact information as well as the [facility] Psychiatry urgent line number. The patient has been instructed to call 911 for emergencies.    Patient and plan of care were discussed with  the Attending MD,Dr. Kathleene Hazel, who agrees with the above statement and plan.      Subjective:    Interval History:   Patient has moved into her own apartment with the help of the Section 8 program. Still has good family support with the baby. Also has a job since the last appointment. Still looking for a therapist in the community. Reports mood has improved with LAI maintenna and Wellbutrin. Patient reports Wellbutrin has been helpful with focus and energy level. Despite general dislike for oral medications, she reports feeling motivated to continue PO wellbutrin. She reports some grogginess recently that she and family believe may be due to abilify dosing being too high; however, she also reports that she recently required a iron transfusion due to her anemia.   ??   Objective:    Mental Status Exam:  Appearance:    Appears stated age   Motor:   No abnormal movements   Speech/Language:    Normal rate, volume, tone, fluency and Language intact, well formed   Mood:   less angry but depressed   Affect:   Cooperative, Decreased range and Mood congruent   Thought process and Associations:   Logical, linear, clear, coherent, goal directed   Abnormal/psychotic thought content:     Denies SI, HI, self harm, delusions, obsessions, paranoid ideation, or ideas of reference   Perceptual disturbances:     Denies auditory and visual hallucinations, behavior not concerning for response to internal stimuli     Other:          Visit was completed face to face.      Tommy Medal, MD

## 2021-02-17 NOTE — Unmapped (Signed)
Regarding: headaches, lightheaded, dizzines, nose bleed  ----- Message from June J Sparks sent at 02/17/2021  1:17 AM EST -----  If you had not called [facility], what do you think you would have done?   Go to Urgent Care / Clinic Walk-in Hours

## 2021-02-23 NOTE — Unmapped (Signed)
[facility] Specialty Pharmacy Clinic Administered Medication Refill Coordination Note      NAME:Ashley Dunlap DOB: 10/08/1990      Medication: Abilify Maintena    Day Supply: 28 days      SHIPPING      Next delivery from [facility] [phone]) to [facility] Adult PSYCH for Ashley Dunlap is scheduled for 01/04.    Clinic contact: Ilsa Iha -[place]     Patient's next nurse visit for administration: 02/23.    We will follow up with clinic monthly for standard refill processing and delivery.      Ashley Dunlap  Specialty Pharmacy Technician

## 2021-03-03 ENCOUNTER — Institutional Professional Consult (permissible substitution): Admit: 2021-03-03 | Discharge: 2021-03-04 | Payer: MEDICARE

## 2021-03-03 DIAGNOSIS — F3170 Bipolar disorder, currently in remission, most recent episode unspecified: Principal | ICD-10-CM

## 2021-03-03 MED ADMIN — ARIPiprazole ER (ABILIFY MAINTENA) injection 400 mg: 400 mg | INTRAMUSCULAR | @ 17:00:00

## 2021-03-03 NOTE — Unmapped (Signed)
Patient came in today for injection only visit.  Vitals were within normal limits.  Patients medication was patient supplied.  See MAR for details.

## 2021-03-03 NOTE — Unmapped (Signed)
Addended by: Berlin Hun on: 03/03/2021 12:20 PM     Modules accepted: Level of Service

## 2021-03-04 NOTE — Unmapped (Signed)
I discussed the case with the resident and I agree with the findings and plan as documented in the Resident???s note.    Kathleene Hazel, MD  Attending Psychiatrist

## 2021-03-15 ENCOUNTER — Ambulatory Visit: Admit: 2021-03-15 | Discharge: 2021-03-15 | Discharge status: Against Medical Advice | Payer: MEDICARE

## 2021-03-15 LAB — URINALYSIS WITH CULTURE REFLEX
BACTERIA: NONE SEEN /HPF
BILIRUBIN UA: NEGATIVE
BLOOD UA: NEGATIVE
GLUCOSE UA: NEGATIVE
KETONES UA: NEGATIVE
NITRITE UA: NEGATIVE
PH UA: 6.5 (ref 5.0–9.0)
PROTEIN UA: NEGATIVE
RBC UA: 1 /HPF (ref <=4)
SPECIFIC GRAVITY UA: 1.01 (ref 1.003–1.030)
SQUAMOUS EPITHELIAL: 4 /HPF (ref 0–5)
UROBILINOGEN UA: <2
WBC UA: 2 /HPF (ref 0–5)

## 2021-03-15 LAB — COMPREHENSIVE METABOLIC PANEL
ALBUMIN: 3.7 g/dL (ref 3.4–5.0)
ALKALINE PHOSPHATASE: 57 U/L (ref 46–116)
ALT (SGPT): 44 U/L (ref 10–49)
ANION GAP: 6 mmol/L (ref 5–14)
AST (SGOT): 39 U/L — ABNORMAL HIGH (ref <=34)
BILIRUBIN TOTAL: 0.4 mg/dL (ref 0.3–1.2)
BLOOD UREA NITROGEN: 7 mg/dL — ABNORMAL LOW (ref 9–23)
BUN / CREAT RATIO: 11
CALCIUM: 9.3 mg/dL (ref 8.7–10.4)
CHLORIDE: 106 mmol/L (ref 98–107)
CO2: 29 mmol/L (ref 20.0–31.0)
CREATININE: 0.66 mg/dL
EGFR CKD-EPI (2021) FEMALE: >90 mL/min/{1.73_m2} (ref >=60)
GLUCOSE RANDOM: 85 mg/dL (ref 70–179)
POTASSIUM: 4.2 mmol/L (ref 3.4–4.8)
PROTEIN TOTAL: 7.4 g/dL (ref 5.7–8.2)
SODIUM: 141 mmol/L (ref 135–145)

## 2021-03-15 LAB — CBC W/ AUTO DIFF
BASOPHILS ABSOLUTE COUNT: 0 10*9/L (ref 0.0–0.1)
BASOPHILS RELATIVE PERCENT: 0.7 %
EOSINOPHILS ABSOLUTE COUNT: 0 10*9/L (ref 0.0–0.5)
EOSINOPHILS RELATIVE PERCENT: 1.1 %
HEMATOCRIT: 35.1 % (ref 34.0–44.0)
HEMOGLOBIN: 11 g/dL — ABNORMAL LOW (ref 11.3–14.9)
LYMPHOCYTES ABSOLUTE COUNT: 1.4 10*9/L (ref 1.1–3.6)
LYMPHOCYTES RELATIVE PERCENT: 46.6 %
MEAN CORPUSCULAR HEMOGLOBIN CONC: 31.5 g/dL — ABNORMAL LOW (ref 32.0–36.0)
MEAN CORPUSCULAR HEMOGLOBIN: 27.3 pg (ref 25.9–32.4)
MEAN CORPUSCULAR VOLUME: 86.9 fL (ref 77.6–95.7)
MEAN PLATELET VOLUME: 6.6 fL — ABNORMAL LOW (ref 6.8–10.7)
MONOCYTES ABSOLUTE COUNT: 0.3 10*9/L (ref 0.3–0.8)
MONOCYTES RELATIVE PERCENT: 9 %
NEUTROPHILS ABSOLUTE COUNT: 1.3 10*9/L — ABNORMAL LOW (ref 1.8–7.8)
NEUTROPHILS RELATIVE PERCENT: 42.6 %
PLATELET COUNT: 349 10*9/L (ref 150–450)
RED BLOOD CELL COUNT: 4.04 10*12/L (ref 3.95–5.13)
RED CELL DISTRIBUTION WIDTH: 23 % — ABNORMAL HIGH (ref 12.2–15.2)
WBC ADJUSTED: 3.1 10*9/L — ABNORMAL LOW (ref 3.6–11.2)

## 2021-03-15 LAB — LIPASE: LIPASE: 28 U/L (ref 12–53)

## 2021-03-15 LAB — HIGH SENSITIVITY TROPONIN I - SERIAL: HIGH SENSITIVITY TROPONIN I: <3 ng/L (ref <=34)

## 2021-03-15 LAB — TSH: THYROID STIMULATING HORMONE: 2.121 u[IU]/mL (ref 0.550–4.780)

## 2021-03-15 LAB — PREGNANCY, URINE: PREGNANCY TEST URINE: NEGATIVE

## 2021-03-15 LAB — SLIDE REVIEW

## 2021-03-15 NOTE — Unmapped (Signed)
Patient called to speak with provider regarding some issues she has been experiencing over the past few days. Patient states that she came in to receive her LAI on 03/03/21. She states that it has not seemed to have helped, she states that she has been very depressed and has been experiencing crying spells. She states that she is unsure of what to do and would like to speak to provider as soon as possible for some assistance. Best contact number is [phone]. Please advise.

## 2021-03-15 NOTE — Unmapped (Shared)
[facility]  Emergency Department Medical Screening Examination     Subjective     Ashley Dunlap is a 31 y.o. female presenting for evaluation of Chest Pain. Patient reports 3 days of chest pain and migraine, also endorses increased anxiety and depression. She endorses 10/10 chest pain currently in triage. Denies SI/HI.      Abbreviated Review of Systems/Covid Screen  Constitutional: Negative for fever  Respiratory: Negative for cough. Negative for difficulty breathing.    Objective     ED Triage Vitals   Enc Vitals Group      BP 03/15/21 1350 144/94      Heart Rate 03/15/21 1331 68      SpO2 Pulse --       Resp 03/15/21 1349 18      Temp 03/15/21 1349 36.6 ??C (97.9 ??F)      Temp src --       SpO2 03/15/21 1331 100 %     Focused Physical Exam  Constitutional: No acute distress.  Respiratory: Non-labored respirations.  Neurological: Clear speech. No gross focal neurologic deficits are appreciated.  ?  Assessment & Plan     ***    A medical screening exam has been performed. At the time of this evaluation, no emergency medical condition requiring immediate stabilization has been identified nor is there suspicion for imminent decompensation. Appropriate triage protocols will be implemented and a comprehensive ED evaluation with disposition will be completed by a healthcare provider when an appropriate ED location becomes available. The patient is aware that this is an initial encounter only and verbalizes understanding and agreement with the plan.     Emergency Department operations continue to be impacted by the COVID-19 pandemic.     Dondra Prader, MD.   March 15, 2021 1:49 PM    Documentation assistance was provided by Everlene Balls, Scribe, on March 15, 2021 at 1:49 PM for Dondra Prader, MD.     {*** note to provider: please use [email or web address] to enter a scribe attestation}

## 2021-03-15 NOTE — Unmapped (Addendum)
Pt here for CP and migraine, with anxiety as well, ongoing x3 days.

## 2021-03-22 ENCOUNTER — Telehealth: Admit: 2021-03-22 | Discharge: 2021-03-23 | Payer: MEDICARE | Attending: Family | Primary: Family

## 2021-03-22 DIAGNOSIS — Z1321 Encounter for screening for nutritional disorder: Principal | ICD-10-CM

## 2021-03-22 DIAGNOSIS — F3170 Bipolar disorder, currently in remission, most recent episode unspecified: Principal | ICD-10-CM

## 2021-03-22 DIAGNOSIS — R635 Abnormal weight gain: Principal | ICD-10-CM

## 2021-03-22 DIAGNOSIS — Z9884 Bariatric surgery status: Principal | ICD-10-CM

## 2021-03-22 DIAGNOSIS — R799 Abnormal finding of blood chemistry, unspecified: Principal | ICD-10-CM
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
## 2021-03-24 ENCOUNTER — Institutional Professional Consult (permissible substitution): Admit: 2021-03-24 | Discharge: 2021-03-25 | Payer: MEDICARE

## 2021-03-24 MED ORDER — PREDNISONE 20 MG TABLET
ORAL_TABLET | 0 refills | 0 days | Status: CP
Start: 2021-03-24 — End: ?

## 2021-03-24 NOTE — Unmapped (Signed)
Addended by: Coralee Rud on: 03/24/2021 12:17 PM     Modules accepted: Level of Service

## 2021-03-24 NOTE — Unmapped (Signed)
[place] Community education officer Encounter  This medical encounter was conducted virtually using [email or web address]  TeleHealth protocols.    Patient ID: Ashley Dunlap is a 31 y.o. female who presents by telephone interaction for evaluation.    I have identified myself to the patient and conveyed my credentials to [employer].   Patient has signed informed consent on file in medical record.    Present on Phone Call: Is there someone else in the room? No..    Assessment/Plan:    Diagnoses and all orders for this visit:    Low back pain due to bilateral sciatica    Other orders  -     predniSONE (DELTASONE) 20 MG tablet; Take 3 po x 3d then 2po x 3d then 1 po x3d         -- Discussed the new prescription noted above, including potential side effects, drug interactions, instructions for taking the medication, and the consequences of not taking it.  -- Patient verbalized an understanding of today's assessment and recommendations, as well as the purpose of ongoing medications.    Referred to specialist: recommended  ortho now if this taper does not help quickly. especially if more weakness.  Ice  Treatments explained. Tylenol use.     Medication adherence and barriers to the treatment plan have been addressed. Opportunities to optimize healthy behaviors have been discussed. Patient / caregiver voiced understanding.              Subjective:     HPI  Ashley Dunlap is 31 y.o. and presents today in the [facility] with low back pain symptoms.  The PCP for this patient is Petra Kuba, MD.2 days ago. Was lifting her baby. Felt it in back first but now down both legs down to ankles. Some tingling. Thinks some weakness but hard to tell from the pain. Getting out of bed for bathroom. Marland Kitchen Has a hx of bulging discs. Taking only tylenol. Had gastric bypass. Has had prednisone before. No bowel or bladder control issues. No vomiting or fever.       ROS  Review of Systems     All other ROS per HPI.    I have reviewed the problem list, past medical history, past family history, medications, and allergies and have updated/reconciled them if needed.            Objective:   This visit was converted from a video visit to a telephone visit for the following reason: Patient technology issues    As part of this Telephone Visit, no in-person exam was conducted.             The patient reports they are currently: at home. I spent 15 minutes on the phone with the patient on the date of service. I spent an additional 2 minutes on pre- and post-visit activities on the date of service.     The patient was physically located in [place] or a state in which I am permitted to provide care. The patient and/or parent/guardian understood that s/he may incur co-pays and cost sharing, and agreed to the telemedicine visit. The visit was reasonable and appropriate under the circumstances given the patient's presentation at the time.    The patient and/or parent/guardian has been advised of the potential risks and limitations of this mode of treatment (including, but not limited to, the absence of in-person examination) and has agreed to be treated using telemedicine. The patient's/patient's family's questions regarding telemedicine  have been answered.     If the visit was completed in an ambulatory setting, the patient and/or parent/guardian has also been advised to contact their provider???s office for worsening conditions, and seek emergency medical treatment and/or call 911 if the patient deems either necessary.

## 2021-03-25 NOTE — Unmapped (Signed)
[facility] Administered Medication Refill Coordination Note      NAME:[name] DOB: 1990/09/27      Medication: Abilify Maintena    Day Supply: 28 days      SHIPPING      Next delivery from [facility] [phone]) to [facility] for Ashley Dunlap is scheduled for 02/01.    Clinic contact: Javier Docker    Patient's next nurse visit for administration: 02/02.    We will follow up with clinic monthly for standard refill processing and delivery.      Berish Bohman Samella Parr  Specialty Pharmacy Technician

## 2021-03-28 ENCOUNTER — Ambulatory Visit: Admit: 2021-03-28 | Discharge: 2021-03-29 | Payer: MEDICARE

## 2021-03-28 MED ORDER — GABAPENTIN 300 MG CAPSULE
ORAL_CAPSULE | Freq: Three times a day (TID) | ORAL | 1 refills | 15 days | Status: CP
Start: 2021-03-28 — End: ?

## 2021-03-28 NOTE — Unmapped (Addendum)
ORTHOPAEDIC NOTE     Ashley Arizpe L. Neveah Bang, PA-C        Ashley Dunlap    MRN: [id number]  DOB: [birth year]    Date of visit: 03/28/2021    Clinic location: [place]     ASSESSMENT:     Right lumbar radiculopathy symptomatic 2 weeks with right lateral ankle sprain sustained on 03/25/2021     PLAN:     Patient is failed oral prednisone, epidural steroid injection x2, physical therapy and I have referred her to the spine center  Prescription for gabapentin was provided for pain relief she understands that this can cause somnolence  Lace up ankle brace was provided for right ankle    -Advised OTC analgesic PRN pain  -Discussed treatment options and patient was amenable to the above plan and was instructed to call and be seen if there is any increasing pain or concerns.     Follow up: Spine center low back pain, Ashley Dunlap as needed for the right ankle       Chief Complaint:     Low back pain     SUBJECTIVE:     HPI: Ashley Dunlap is a  31 y.o. with a PMHx as below presenting to [facility] complain of chronic low back pain that has become worse over the past 2 weeks after picking up her child.  She is not breast-feeding.  She trialed oral prednisone twice, previous epidural steroid injections which has helped to some degree, physical therapy which has not helped.  Denies bowel or bladder changes.  Denies fever, chills, night sweats.  Of note she states that she twisted her right ankle 3 days ago due to visual impairment.       Allergies  Allergies   Allergen Reactions   ??? Oxycontin [Oxycodone] Hives   ??? Shellfish Containing Products Hives and Swelling   ??? Ibuprofen      Gastric bypass patient       Past Medical History  Past Medical History:   Diagnosis Date   ??? 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 Anemia    ??? Anxiety    ??? Arthritis    ??? Depression    ??? Diabetes mellitus (CMS-HCC)    ??? Diabetes mellitus (CMS-HCC)     type 2, uncomplicate   ??? Endometriosis    ??? Female infertility    ??? GERD (gastroesophageal reflux disease)    ??? Lack of access to transportation    ??? Migraine    ??? Morbid obesity with BMI of 50.0-59.9, adult (CMS-HCC)    ??? Peripheral polyneuropathy    ??? 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 Sickle cell trait (CMS-HCC)    ??? Strain of right pectoralis muscle    ??? Visual impairment    ??? Vitamin B12 deficiency         PHYSICAL EXAM:     MSK: Lumbar spine  Inspection: No edema, no erythema, skin intact  Palpation: Tenderness along the right lumbar paraspinous muscles, tenderness along the ATFL, minimal tenderness on the distal fibular tip  ROM: Full range of motion of the lumbar spine, pain with lateral bending  Strength: Full symmetric strength bilateral lower extremities  Negative anterior drawer, negative Jiggetts's, no homan's sign, positive straight leg raise  Diminished sensation bilateral lower extremities consistent with neuropathy  Dorsalis pedal pulses easily palpable      Imaging   Two views of the lumbar  Spine independently reviewed and interpreted by myself show Multilevel lumbar degenerative changes. No other obvious fractures, lucencies, dislocations, or abnormalities.  I offered x-rays of her right ankle but she declined  MEDICAL DECISION MAKING (level of service defined by 2/3 elements)     Number/Complexity of Problems Addressed 1 acute, uncomplicated illness or injury (99203/99213)   Amount/Complexity of Data to be Reviewed/Analyzed Independent interpretation of a test performed by another physician/other qualified health care professional (99204/99214)   Risk of Complications/Morbidity/Mortality of Management Prescription Medication (99204/99214)   DME ORDER:  Dx: A21.308M, Sprain of anterior talofibular ligament of right ankle, initial encounter  Dispense at Surgery: No  Location: [facility] Location: Foot and Ankle  Foot and Ankle: Lace-Up Brace  Laterality: Right  Print for Patient: No  Weightbearing: Full  Sizing: size 11 women    DME Lower Extremity, The patient was prescribed this orthosis The patient is ambulatory but has weakness and/or instability of their lower extremity which requires stabilization from this semi-rigid/ rigid orthosis to improve their function.           cc:  Ashley Kuba, MD  *Patient note was created using Dragon Dictation sotware. Errors in syntax or grammar may not have been identified and edited on initial review.

## 2021-03-28 NOTE — Unmapped (Signed)
Addended by: Arlester Marker on: 03/28/2021 02:45 PM     Modules accepted: Level of Service

## 2021-03-30 ENCOUNTER — Institutional Professional Consult (permissible substitution): Admit: 2021-03-30 | Discharge: 2021-03-30 | Payer: MEDICARE

## 2021-03-30 DIAGNOSIS — F3170 Bipolar disorder, currently in remission, most recent episode unspecified: Principal | ICD-10-CM

## 2021-03-30 MED ADMIN — ARIPiprazole ER (ABILIFY MAINTENA) injection 300 mg: 300 mg | INTRAMUSCULAR | @ 17:00:00 | Stop: 2022-03-02

## 2021-03-30 MED ADMIN — ARIPiprazole ER (ABILIFY MAINTENA) injection 400 mg: 400 mg | INTRAMUSCULAR | @ 17:00:00

## 2021-03-30 MED FILL — ABILIFY MAINTENA 400 MG INTRAMUSCULAR SUSPENSION,EXTENDED RELEASE: INTRAMUSCULAR | 28 days supply | Qty: 1 | Fill #5

## 2021-03-30 NOTE — Unmapped (Addendum)
Patient came in today for injection only visit.  Vitals are within normal limit.  See MAR for injection details.

## 2021-03-30 NOTE — Unmapped (Signed)
Addended by: Matilde Bash R on: 03/30/2021 12:28 PM     Modules accepted: Level of Service

## 2021-04-08 NOTE — Unmapped (Addendum)
error 

## 2021-04-11 ENCOUNTER — Ambulatory Visit: Admit: 2021-04-11 | Payer: MEDICARE | Attending: Family Medicine | Primary: Family Medicine

## 2021-04-14 ENCOUNTER — Ambulatory Visit
Admit: 2021-04-14 | Discharge: 2021-04-15 | Payer: MEDICARE | Attending: Student in an Organized Health Care Education/Training Program | Primary: Student in an Organized Health Care Education/Training Program

## 2021-04-15 NOTE — Unmapped (Signed)
Two skin tags were removed without difficulty. Discussed after care.

## 2021-04-15 NOTE — Unmapped (Signed)
I saw and evaluated the patient, participating in the key portions of the service. I was present for entirety of procedure.  I reviewed the resident???s note.  I agree with the resident???s findings and plan. Erie Noe, MD

## 2021-04-15 NOTE — Unmapped (Signed)
[facility]  Established Patient Clinic Note    Assessment/Plan:   [name] is a 31 y.o.female    Problem List Items Addressed This Visit        Musculoskeletal and Integument    Skin tag     Two skin tags were removed without difficulty. Discussed after care.              Subjective   Ashley Dunlap is a 31 y.o. female  coming to clinic today for the following issues:    Chief Complaint   Patient presents with   ??? Skin Check     Moles on scalp and upper back, Dr. Note for social services      HPI:    Snip Excision Procedure Note    Indication(s): Irritated acrochordon    Primary/Referring Provider:  Petra Kuba    Resident Physician:  Petra Kuba   Attending Physician:  Carlean Purl     Pre-operative Diagnosis: Irritated acrochordon   Post-operative Diagnosis: same    Location(s): upper back and right scalp     Allergies:  reviewed allergy section in the chart    Consent:    I reviewed the risks (bleeding, infection, scarring, poor cosmetic result), benefits (removal of lesion), and alternatives (observation) to proposed procedure.  I answered all of the patient's questions and addressed all concerns.  Patient agreed to proceed.  Verbal and written consent obtained; form scanned to chart.      Time-out:  Performed immediately prior to procedure      Anesthesia: Lidocaine 1% with epinephrine (1:100,000) with sodium bicarbonate 8.4% (3:1 ratio)    Procedure Details:   Each lesion and surrounding area were prepped with chlorhexidine.  Using G#27 1-1/2 needle,1 mL of 1% lidocaine w/epi was injected to dermal area causing each lesion to elevate slightly.  Using a derma blade, I performed a shave bx of 2 lesion(s), one along the scalp, one along the back.  Simple pressure and aluminum chloride were applied to biopsy sites for hemostasis.   dressing was then applied.  The patient tolerated the procedure well.      Specimen(s): The specimen were NOT sent for pathologic examination. EBL: <3mL    Condition: Stable    Complications:  None    Post-operative Instructions and Plan:  Patient was instructed to keep the wound dry for 24 hours.  If bleeding occurs, apply pressure x 15-46mins or compression (eg, Ace wraps), elevate, &/or apply ice.  If bleeding persists, call clinic for further instructions.  Apply petrolatum twice daily to keep wound moist.  Keep wound covered with dressing, esp. at night to avoid drying.  Call or RTC if signs of wound infection ensue (pain, purulent drainage, significant crusting, advancing redness, increasing warmth).  Recommended that the patient use OTC acetaminophen and OTC ibuprofen as needed for pain.     ?? CPT: Removal, skin tags (any method): 11200 (up to 15); 11201 (each additional 10 lesions, use w/ CPT 11200)    I have reviewed the problem list, medications, and allergies and have updated/reconciled them if needed.    Ashley Dunlap  reports that she quit smoking about 4 years ago. Her smoking use included cigarettes. She has a 0.50 pack-year smoking history. She has never used smokeless tobacco.  Health Maintenance   Topic Date Due   ??? Urine Albumin/Creatinine Ratio  11/25/2020   ??? Hemoglobin A1c  06/02/2021   ??? Foot Exam  12/02/2021   ???  Retinal Eye Exam  01/10/2022   ??? Serum Creatinine Monitoring  03/15/2022   ??? Potassium Monitoring  03/15/2022   ??? DTaP/Tdap/Td Vaccines (9 - Td or Tdap) 05/22/2030   ??? Pneumococcal Vaccine 0-64  Completed   ??? Hepatitis C Screen  Completed   ??? COVID-19 Vaccine  Completed   ??? Influenza Vaccine  Completed       Objective     VITALS: BP 99/67  - Pulse 72  - Temp 36.5 ??C (97.7 ??F) (Temporal)  - Ht 173.1 cm (5' 8.15)  - Wt (!) 102.1 kg (225 lb)  - BMI 34.06 kg/m??     Physical Exam  Vitals reviewed.   Constitutional:       Appearance: Normal appearance.   Cardiovascular:      Comments: All extremities well perfused  Pulmonary:      Effort: Pulmonary effort is normal.      Comments: On RA  Skin:     General: Skin is warm. Comments: . 5 cm flesh colored skin tag on upper back, 1 cm flesh colored skin tag on scalp   Neurological:      Mental Status: She is alert.   Psychiatric:         Mood and Affect: Mood normal.         Behavior: Behavior normal.         LABS/IMAGING  I have reviewed pertinent recent labs and imaging in Epic    [facility]  [place] [facility] [place] [facility]  [address] [place], [place] [address] ??? Telephone [phone] ??? Fax [phone]  [email or web address]

## 2021-04-20 NOTE — Unmapped (Signed)
[facility] Administered Medication Refill Coordination Note      NAME:Ashley Dunlap DOB: [birth year]      Medication: Abilify Maintena    Day Supply: 28 days      SHIPPING      Next delivery from [facility] [phone]) to [facility] for Ashley Dunlap is scheduled for n/a.    Clinic contact: Javier Docker    Patient's next nurse visit for administration: n/a-clinic denied refill @ this time still have dose will f/u in 3 weeks.    We will follow up with clinic monthly for standard refill processing and delivery.      Ashley Dunlap  Specialty Pharmacy Technician

## 2021-04-21 ENCOUNTER — Ambulatory Visit: Admit: 2021-04-21 | Discharge: 2021-04-22 | Payer: MEDICARE

## 2021-04-21 DIAGNOSIS — M5416 Radiculopathy, lumbar region: Principal | ICD-10-CM

## 2021-04-21 DIAGNOSIS — M5127 Other intervertebral disc displacement, lumbosacral region: Principal | ICD-10-CM

## 2021-04-21 MED ORDER — METHOCARBAMOL 500 MG TABLET
ORAL_TABLET | Freq: Three times a day (TID) | ORAL | 0 refills | 5 days | Status: CP | PRN
Start: 2021-04-21 — End: ?

## 2021-04-21 NOTE — Unmapped (Addendum)
Medications: Take over-the-counter medications as needed and as tolerated  We have prescribed Robaxin  Activity: Activities as tolerated.  Conservative Care: Let's continue to monitor your symptoms.  Surgical Care: You will follow-up with an Orthopaedic Spine surgeon for a consult.  You are assessed to be a possible surgical candidate.  Imaging studies: MRI of the Lumbosacral spine was ordered. Medical necessity: Radiographs have been performed/ordered. Failed supervised conservative treatments. Presence of red flag findings as noted on our evaluation.  Labs: None  Return in about 6 weeks (around 06/02/2021).     Contact our nursing team via MyChart or [phone] with any clinical questions/concerns.  Our scheduling team can be reached at [phone].

## 2021-04-21 NOTE — Unmapped (Signed)
ORTHOPAEDIC SPINE CLINIC NOTE       Rolland Bimler, PA-C  Physician Assistant  [email or web address]  [phone]        Patient Name:Ashley Dunlap  MRN: [id number]  DOB: Jul 29, 1990    Date: 04/21/2021    PCP: Petra Kuba, MD    ASSESSMENT:   Ashley Dunlap  31 y.o. female  [id number]  Not working. Depression. DM. h/o gastric bypass surgery. 00  Bilateral lumbar radic. Failed PT. ESI x2 failed.  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  We will obtain a new lumbar MRI for further evaluation as symptoms of radiculopathy have progressively worsened and are refractory to conservative management.  Robaxin prescribed as needed for muscle spasms.      Patient Instructions ??   Medications: Take over-the-counter medications as needed and as tolerated  ?? We have prescribed Robaxin  ?? Activity: Activities as tolerated.  ?? Conservative Care: Let's continue to monitor your symptoms.  ?? Surgical Care: You will follow-up with an Orthopaedic Spine surgeon for a consult.  ?? You are assessed to be a possible surgical candidate.  ?? Imaging studies: MRI of the Lumbosacral spine was ordered. Medical necessity: Radiographs have been performed/ordered. Failed supervised conservative treatments. Presence of red flag findings as noted on our evaluation.  ?? Labs: None  ?? Return in about 6 weeks (around 06/02/2021).     Contact our nursing team via MyChart or [phone] with any clinical questions/concerns.  Our scheduling team can be reached at [phone].       Orders Placed This Encounter   Procedures   ??? MRI Lumbar Spine Wo Contrast     Medications Prescribed Today             methocarbamoL (ROBAXIN) 500 MG tablet Take 2 tablets (1,000 mg total) by mouth Three (3) times a day as needed.           SUBJECTIVE:     Chief Complaint:  Chief Complaint   Patient presents with   ??? Back Pain     Low back pain consistently  for the last 5 years, Patient has received PT last year 2022, Patien mentioned that she has pinch nerve. Patient states that she gets a burning & tingling sensation that goes down to her feet.        History of Present Illness:   Ashley Dunlap is a 31 y.o. female with a relevant PMH of depression, DM seen in consultation at the request of Maslow, Gretel Acre, [place] for evaluation of low back pain.    -Chronic LBP for past 5 years that acutely worsened 02/2021 after lifting her child. Low back pain is worsened with excessive bending or activity. Endorses numbness/tingling down bilateral lower extremities to her feet that started 10/2020. Radicular symptoms have continued to gradually worsen despite trying various types of conservative management.  Denies any new bowel/bladder incontinence.    -Lumbar ESI x2 in 01/2021 and 10/2020 that only provided minimal relief  -PT in the year of 2022 which worsened her symptoms  -Currently on gabapentin to minimal relief.         04/21/21 0820   PainSc: 9      Date of onset: since 5 years ago  Location: low back   Frequency: Constant  Modifying factors: worse with standing,  worse with walking, worse with activity  Associated symptoms: numbness, weakness, tingling and stiffness      Current Treatments Previous Treatments   Current Relevant Pain Medications:  ??? Gabapentin 300mg  TID    Current Physical Therapy: PT Recently completed   -made symptoms worse    Adjunct Treatments: Activity Modification    In a Pain Clinic: No Prior Relevant Pain Medications:  ???   Prior Physical Therapy: Yes.    Injections:Yes.    Prior Relevant Surgeries: None.        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     Allergies   Oxycontin [oxycodone], Shellfish containing products, and Ibuprofen   Medications   She has a current medication list which includes the following prescription(s): aripiprazole, cyanocobalamin (vitamin b-12), famotidine, abilify maintena, aripiprazole, aripiprazole, bupropion, chlorhexidine, gabapentin, methocarbamol, multivitamin-min-iron-fa-vit k, prednisone, promethazine, sumatriptan, and trazodone, and the following Facility-Administered Medications: aripiprazole er and aripiprazole er.   Review of Systems A 10-system review was performed by questionnaire and noted in the electronic chart.  Positives noted/discussed.  Balance of systems was negative.    Fever/chills: denies  Recent unexplained weight loss: denies  Bowel/bladder symptoms: denies   Family History Her family history includes Diabetes in her maternal aunt and maternal uncle; Heart disease in her maternal grandfather and maternal grandmother; Hyperlipidemia in her paternal aunt and paternal uncle; Kidney disease in her maternal grandfather and maternal grandmother; No Known Problems in her father and mother.     Social History She  reports that she quit smoking about 4 years ago. Her smoking use included cigarettes. She has a 0.50 pack-year smoking history. She has never used smokeless tobacco. She reports that she does not currently use alcohol. She reports that she does not currently use drugs.        Occupational History   ??? Not on file        OBJECTIVE:     PHYSICAL EXAM:  Vitals: Temp 36.3 ??C (97.4 ??F) (Skin)  - Ht 173.1 cm (5' 8.15)  - Wt (!) 103.6 kg (228 lb 6.4 oz)  - BMI 34.58 kg/m??   Appearance: well-nourished and no acute distress   Skin: No cyanosis or clubbing in bilat hands. and No edema in the feet.  Pulses: 2+ DP pulses bilaterally     Neurologic exam:   Mental Status: Ashley Dunlap is oriented to time, place and person & is alert and cooperative  Motor: Normal bulk and tone without evidence of pronator drift or fasciculations. No abnormal movements.  Gait: normal.    Cerebellum/Coordination: Deferred    Motor R L  Reflexes R L   IP 5 5  Patellar 1-2+ 1-2+   Quad 5  5  Achilles 1-2+ 1-2+       Pathologic R L   TA 5 5  Hoffmann's neg. neg.   EHL 5 5  Clonus neg. neg.   GS 5 5  Babinski neg. neg.     Sensory: Sensation to light touch intact throughout.    Thoracolumbar Spine Exam:  Inspection: No swelling, erythema, deformity, atrophy or hypertrophy noted  Lumbar Spine ROM: mildly restricted  Spine Palpation: non-tender to palpation, normal skin    Facet Loading Test: Positive Bilaterally  Nerve Tension Signs: SLR positive right and SLR positive left    Sacroiliac Joint:   Inspection: Normal alignment. No erythema, discoloration, or asymmetry.  Palpation: No tenderness at PSIS    Right Hip/Knee:   No pain with hip internal rotation/loading. Normal internal rotation ROM.   No knee pain with flexion/extension. No atrophy noted upon inspection. Normal skin. Normal ROM and stability.  Left Hip/Knee:   No pain with hip internal rotation/loading. Normal internal rotation ROM.   No knee pain with flexion/extension. No atrophy noted upon inspection. Normal skin. Normal ROM and stability.       MEDICAL DECISION MAKING    Test Results:  Imaging:   L-spine XR. . Date:02/2021.  Impression: Unremarkable      I, Rolland Bimler, PA-C, personally interpreted the images. Images were reviewed with the patient on the PACS monitor. The available reports were reviewed.    Labs:  Lab Results   Component Value Date    A1C 5.3 12/02/2020       Discussion:  ?? Clinical findings, diagnostic/treatment options, and plan were discussed with the patient.  ?? Activities - Advised gradual return to normal activities, using pain as a guide.  ?? Conservative Care - Options discussed.  ?? Imaging - Discussed pros/cons of advanced imaging options.  ?? Medications - risks/benefits of Robaxin were discussed.  ?? Surgery was discussed.      E&M Coding:    MEDICAL DECISION MAKING (level of service defined by 2/3 elements)     Number/Complexity of Problems Addressed 1 or more chronic illnesses with exacerbation, progression, or side effects of treatment (99204/99214)   Amount/Complexity of Data to be Reviewed/Analyzed 3 points: Review prior notes (1 point per unique source); Review test results (1 point per unique test); Order tests (1 point per unique test); Assessment requiring an independent historian (99204/99214)   Risk of Complications/Morbidity/Mortality of Management Prescription Medication (99204/99214)     Or TIME     Total Time for E/M Services on the Date of Encounter N/A          cc: Maslow, Gretel Acre, PA, Petra Kuba, MD

## 2021-05-06 ENCOUNTER — Ambulatory Visit: Admit: 2021-05-06 | Discharge: 2021-05-07 | Payer: MEDICARE

## 2021-05-09 ENCOUNTER — Ambulatory Visit: Payer: MEDICARE

## 2021-05-09 DIAGNOSIS — W19XXXA Unspecified fall, initial encounter: Principal | ICD-10-CM

## 2021-05-09 DIAGNOSIS — R55 Syncope and collapse: Principal | ICD-10-CM

## 2021-05-09 MED ADMIN — acetaminophen (TYLENOL) tablet 650 mg: 650 mg | ORAL | @ 13:00:00 | Stop: 2021-05-09

## 2021-05-09 NOTE — Unmapped (Signed)
[facility]  Emergency Department Provider Note     ED Clinical Impression     Final diagnoses:   Fall, initial encounter (Primary)   Vasovagal syncope      Impression, Medical Decision Making, ED Course     Impression: 31 y.o. 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 who presents following a syncopal episode as described below.    BP 125/78  - Pulse 77  - Temp 36.8 ??C (98.3 ??F) (Oral)  - Resp 18  - SpO2 98%     Initial vitals as above, hemodynamically stable.  Upon initial evaluation in the emergency department, patient is non-toxic appearing.  Normal S1 and S2.  Lungs clear to auscultation bilaterally.  Abdomen soft, nontender, nondistended.  Moist mucous membranes.  Brisk capillary refill.    DDx/MDM: This patient with prior known history of vasovagal syncope presents to the ED for syncopal episode that occurred 2 hours ago. No associated chest pain, palpitations, SOB or known cardiac history to suggest cardiac etiology.  No headache, vomiting, vision changes or focal neurologic complaints to suggest primary CNS etiology.  EKG shows nonspecific T wave inversions and and ST changes in some leads that looks the same as EKG done on 03/15/21.  There is no evidence of WPW, Brugada syndrome, prolonged QT or LVH to suggest hypertrophic cardiomyopathy.  Exam is benign.  There is no heart murmur appreciated.  Lungs are clear and there are normal symmetric pulses in all extremities. Patient has had hysterectomy so no clinical suspicion for ectopic pregnancy.    Syncope as above. Clinical picture is not suggestive of dangerous arrhythmia, ACS, SAH, ectopic pregnancy or other serious cause.  Patient states that her neck and back hurt without any point tenderness present or difficulty ranging or ambulating. Suspect orthostatic vs vasovagal etiology, have given strict return precautions and will discharge at this time.     Discussion of Management with other Physicians, QHP, or Appropriate Source: None  Independent Interpretation of Studies: EKG(s) - see MDM  External Records Reviewed: I have reviewed recent and relevant previous record, including: Outpatient notes - prior ED provider notes and recent family medicine clinic visits  Escalation of Care, Consideration of Admission/Observation/Transfer: Admission not required. Appropriate for outpatient management.  Prescription drug(s) considered but not prescribed: Pain medications - patient has Tylenol and Ibuprofen at home  Diagnostic tests considered but not performed: Imaging of the back and head considered but not performed as patient's PE was benign, no obvious deformities or point tenderness was noted    ____________________________________________    The case was discussed with the attending physician, who is in agreement with the above assessment and plan.      History     Chief Complaint  Chief Complaint   Patient presents with   ??? Fall       HPI   Ashley Dunlap is a 31 y.o. female with past medical history as below who presents with syncopal episode that occurred prior to arrival in the ED.  Patient states that this morning at approximately 6 AM she was showering when she suddenly fell and  had loss of consciousness.  Patient states that she had no preceding symptoms or symptoms that followed.  She states that she is unsure how long she was unconscious for.  She endorses hitting her neck and back on the side of the shower and endorses pain to those areas.  She was able to ambulate afterwards. She endorses that this has happened to her before as she has a history of vasovagal syncope. No family history of sudden cardiac death. She denies any chest pain, shortness of breath, nausea, vomiting, headache, decreased PO intake, or other symptoms. Patient has had hysterectomy, denies pregnancy.    Outside Historian(s): N/A    External Records Reviewed: I have reviewed patient's medical chart.    Past Medical History:   Diagnosis Date   ??? 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 Anemia    ??? Anxiety    ??? Arthritis    ??? Depression    ??? Diabetes mellitus (CMS-HCC)    ??? Diabetes mellitus (CMS-HCC)     type 2, uncomplicate   ??? Endometriosis    ??? Female infertility    ??? GERD (gastroesophageal reflux disease)    ??? Lack of access to transportation    ??? Migraine    ??? Morbid obesity with BMI of 50.0-59.9, adult (CMS-HCC)    ??? Peripheral polyneuropathy    ??? 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 Sickle cell trait (CMS-HCC)    ??? Strain of right pectoralis muscle    ??? Visual impairment    ??? Vitamin B12 deficiency        Past Surgical History:   Procedure Laterality Date   ??? ANKLE SURGERY Right 02/2017   ??? EYE SURGERY     ??? HYSTERECTOMY     ??? KNEE SURGERY     ??? PR LAP GASTRIC BYPASS/ROUX-EN-Y Midline 10/31/2018    Procedure: (R26)LAPAROSCOPY, SURG, GASTRIC RESTRICT PROC; W/GASTRIC BYPASS & ROUX-EN-Y GASTROENTEROS(ROUX LIMB 150 CM/LESS);  Surgeon: Felton Clinton, MD;  Location: MAIN OR [facility];  Service: Gastrointestinal   ??? PR LAP, SURG ENTEROLYSIS Midline 11/12/2019    Procedure: LAPAROSCOPY, SURGICAL, ENTEROLYSIS (FREEING OF INTESTINAL ADHESION) (SEPARATE PROCEDURE);  Surgeon: Felton Clinton, MD;  Location: MAIN OR [facility];  Service: Gastrointestinal   ??? PR UPPER GI ENDOSCOPY,BIOPSY N/A 03/06/2018    Procedure: UGI ENDOSCOPY; WITH BIOPSY, SINGLE OR MULTIPLE;  Surgeon: Dara Lords, MD;  Location[facility];  Service: Gastroenterology   ??? PR UPPER GI ENDOSCOPY,BIOPSY N/A 12/02/2018    Procedure: UGI ENDOSCOPY; WITH BIOPSY, SINGLE OR MULTIPLE;  Surgeon: Thurmon Fair, MD;  Location: [facility] PROCED[facility];  Service: Gastroenterology   ??? PR UPPER GI ENDOSCOPY,BIOPSY N/A 05/02/2019    Procedure: UGI ENDOSCOPY; WITH BIOPSY, SINGLE OR MULTIPLE;  Surgeon: Leland Her, MD;  Location: [facility] PROCED[facility];  Service: Gastroenterology   ??? PR UPPER GI ENDOSCOPY,DIAGNOSIS N/A 11/07/2019    Procedure: UGI ENDO, INCLUDE ESOPHAGUS, STOMACH, & DUODENUM &/OR JEJUNUM; DX W/WO COLLECTION SPECIMN, BY BRUSH OR WASH;  Surgeon: Kela Millin, MD;  [facility];  Service: Gastroenterology   ??? STRABISMUS SURGERY           Current Facility-Administered Medications:   ???  acetaminophen (TYLENOL) tablet 650 mg, 650 mg, Oral, Once, Nat Christen, MD  ???  ARIPiprazole ER (ABILIFY MAINTENA) injection 300 mg, 300 mg, Intramuscular, Q28 Days, Georgina Snell, MD, 300 mg at  03/30/21 1220  ???  ARIPiprazole ER (ABILIFY MAINTENA) injection 400 mg, 400 mg, Intramuscular, Q30 Days, Georgina Snell, MD, 400 mg at 03/30/21 1215    Current Outpatient Medications:   ???  ABILIFY MAINTENA 400 mg injection, , Disp: , Rfl:   ???  ARIPiprazole (ABILIFY MAINTENA) 400 mg SERR injection, Inject 2 mL (400 mg total) into the muscle every twenty-eight (28) days for 12 doses., Disp: 1 each, Rfl: 10  ???  ARIPiprazole (ABILIFY) 10 MG tablet, Take 1 tablet (10 mg total) by mouth daily as needed (If injection (LAI) dose is delayed) for up to 14 days., Disp: 14 tablet, Rfl: 0  ???  ARIPiprazole (ABILIFY) 10 MG tablet, Take 1 tablet by mouth daily., Disp: , Rfl:   ???  buPROPion (WELLBUTRIN XL) 150 MG 24 hr tablet, Take 1 tablet (150 mg total) by mouth daily., Disp: 90 tablet, Rfl: 0  ???  chlorhexidine (PERIDEX) 0.12 % solution, , Disp: , Rfl:   ???  cyanocobalamin, vitamin B-12, 1,000 mcg/mL injection, Inject 1,000 mcg into the muscle., Disp: , Rfl:   ???  famotidine (PEPCID) 20 MG tablet, Take 20 mg by mouth., Disp: , Rfl:   ???  gabapentin (NEURONTIN) 300 MG capsule, Take 1 capsule (300 mg total) by mouth Three (3) times a day., Disp: 45 capsule, Rfl: 1  ???  methocarbamoL (ROBAXIN) 500 MG tablet, Take 2 tablets (1,000 mg total) by mouth Three (3) times a day as needed., Disp: 30 tablet, Rfl: 0  ???  multivitamin-min-iron-FA-vit K 45 mg iron- 800 mcg-120 mcg cap, Take 1 tablet by mouth daily. [place], Disp: , Rfl:   ???  predniSONE (DELTASONE) 20 MG tablet, Take 3 po x 3d then 2po x 3d then 1 po x3d (Patient not taking: Reported on 04/21/2021), Disp: 18 tablet, Rfl: 0  ???  promethazine (PHENERGAN) 25 MG tablet, , Disp: , Rfl:   ???  SUMAtriptan (IMITREX) 50 MG tablet, , Disp: , Rfl:   ???  traZODone (DESYREL) 50 MG tablet, Take 1 tablet (50 mg) nightly. Can take 1 additional tablet (100 mg total) if needed for insomnia. (Patient not taking: Reported on 04/21/2021), Disp: 60 tablet, Rfl: 0    Allergies  Oxycontin [oxycodone], Shellfish containing products, and Ibuprofen    Family History  Family History   Problem Relation Age of Onset   ??? Diabetes Maternal Aunt    ??? Diabetes Maternal Uncle    ??? Hyperlipidemia Paternal Aunt    ??? Hyperlipidemia Paternal Uncle    ??? Heart disease Maternal Grandmother    ??? Kidney disease Maternal Grandmother    ??? Heart disease Maternal Grandfather    ??? Kidney disease Maternal Grandfather    ??? No Known Problems Mother    ??? No Known Problems Father        Social History  Social History     Tobacco Use   ??? Smoking status: Former     Packs/day: 0.25     Years: 2.00     Pack years: 0.50     Types: Cigarettes     Quit date: 06/28/2016     Years since quitting: 4.8   ??? Smokeless tobacco: Never   Vaping Use   ??? Vaping Use: Never used   Substance Use Topics   ??? Alcohol use: Not Currently   ??? Drug use: Not Currently        Physical Exam     VITAL SIGNS:      Vitals:  05/09/21 0806 05/09/21 0810   BP:  125/78   Pulse: 77 Resp:  18   Temp:  36.8 ??C (98.3 ??F)   TempSrc:  Oral   SpO2: 98%        Constitutional: Alert and oriented. No acute distress.  Eyes: Conjunctivae are normal.  HEENT: Normocephalic and atraumatic. Conjunctivae clear. No congestion. Moist mucous membranes.   Cardiovascular: Rate as above, regular rhythm. Normal and symmetric distal pulses. Brisk capillary refill. Normal skin turgor.  Respiratory: Normal respiratory effort. Breath sounds are normal. There are no wheezing or crackles heard.  Gastrointestinal: Soft, non-distended, non-tender.  Genitourinary: Deferred.  Musculoskeletal: Patient has cast in place on her left lower extremity from recent fracture unrelated to presentation today.  Non-tender with normal range of motion in all extremities.  Neurologic: Normal speech and language. No gross focal neurologic deficits are appreciated. Patient is moving all extremities equally, face is symmetric at rest and with speech.  Skin: Skin is warm, dry and intact. No rash noted.  Psychiatric: Mood and affect are normal. Speech and behavior are normal.     Radiology     No orders to display       Pertinent labs & imaging results that were available during my care of the patient were independently interpreted by me and considered in my medical decision making (see chart for details).    Portions of this record have been created using Scientist, clinical (histocompatibility and immunogenetics). Dictation errors have been sought, but may not have been identified and corrected.         Nat Christen, MD  Resident  05/09/21 [phone]

## 2021-05-09 NOTE — Unmapped (Signed)
Patient reports fall due to syncope this am, fell on concrete floor from standing position complaining of back pain, neck pain and headache

## 2021-05-10 ENCOUNTER — Ambulatory Visit: Admit: 2021-05-10 | Discharge: 2021-05-11 | Payer: MEDICARE

## 2021-05-10 NOTE — Unmapped (Signed)
There were no encounter diagnoses.    No follow up needed  Surgery is not indicated at this time  Avoid nicotine/tobacco  Maintain a healthy weight    Stay physically active and exercise regularly as tolerated ([email or web address])  Maintain good sleeping habits    Contact our clinic nursing team via MyChart or [phone] with any questions/concerns.  Contact our Spine Surgery Nurse Coordinator at [phone] with surgery-related issues.

## 2021-05-10 NOTE — Unmapped (Signed)
ORTHOPAEDIC SPINE CLINIC NOTE       Arvil Persons. Lorenda Peck, MD  Assistant Professor  Orthopaedic Spine Surgeon  [email or web address]  [phone]         Patient Name:Ashley Dunlap  MRN: [id number]  DOB: 1990-09-18  Date: 05/10/2021  PCP: Petra Kuba, MD    ASSESSMENT:     Ashley Dunlap  31 y.o. female  [phone]  Not working. Depression. DM. h/o gastric bypass surgery. 00  Bilateral lumbar radic. Failed PT. ESI x2 failed.  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 After reviewing her current symptoms and most recent imaging, we see some arthritis present contributing to some very mild stenosis at L4/5. She has some present pathology but a potential operation would be very massive and risky for very little potential to help. We emphasized that back pain does not reliably fix back pain.       There were no encounter diagnoses.    No orders of the defined types were placed in this encounter.             SUBJECTIVE:     Chief Complaint:  Chief Complaint   Patient presents with   ??? Back Pain       History of Present Illness:   Ashley Dunlap is a 31 y.o. female seen in consultation at the request of Maslow, Gretel Acre, [place] for evaluation of low back pain.           05/10/21 1533   PainSc: 10-Worst pain ever   Date of onset: since: Longstanding for 8-9 years      Laterality of symptoms: Bilateral R = L  Extremity: 50%  Frequency: Constant She has pain at night.    Modifying factors: worse with walking, worse with sitting, worse with lying down  Associated symptoms: n/a    Where the pain starts and where it goes to: Back of leg into back of foot    Numbness in LE is present/ absent; if so, intermittently/ constantly?: Unknown. Rest relief?:yes  Positional relief?: yes   Best/Worst position?: Better leaning forward      Functional limitations include:     Current Treatments Previous Treatments   Current Relevant Pain Medications:  dswmedications: Unknown    Current Physical Therapy:   no    Adjunct Treatments:   Activity Modification and OTC Voltaren Gel    In a Pain Clinic:   yes - recently discontinued because waning relief Prior Relevant Pain Medications:  Medication list attached    Prior Physical Therapy:   Yes. no relief    Injections:  Yes. no relief    Prior Relevant Surgeries:  None.        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     Allergies   Oxycontin [oxycodone], Shellfish containing products, and Ibuprofen   Medications   She has a current medication list which includes the following prescription(s): abilify maintena, aripiprazole, aripiprazole, bupropion, chlorhexidine, cyanocobalamin (vitamin b-12), famotidine, gabapentin, methocarbamol, multivitamin-min-iron-fa-vit k, promethazine, sumatriptan, aripiprazole, prednisone, and trazodone, and the following Facility-Administered Medications: aripiprazole er and aripiprazole er.   Review of Systems A 10-system review was performed by questionnaire and noted in the electronic chart.  Positives noted/discussed.  Balance of systems was negative.    Fever/chills :denies  Bowel/bladder symptoms :denies   Family History Her family history includes Diabetes in her maternal aunt and maternal uncle; Heart disease in her maternal grandfather and maternal grandmother; Hyperlipidemia in her paternal aunt and paternal uncle; Kidney disease in her maternal grandfather and maternal grandmother; No Known Problems in her father and mother.     Social History She  reports that she quit smoking about 4 years ago. Her smoking use included cigarettes. She has a 0.50 pack-year smoking history. She has never used smokeless tobacco. She reports that she does not currently use alcohol. She reports that she does not currently use drugs.        Occupational History   ??? Not on file        OBJECTIVE:     PHYSICAL EXAM:  Vitals: Temp 36.8 ??C (98.2 ??F)  - Ht 173.1 cm (5' 8.15)  - Wt (!) 107.7 kg (237 lb 6.4 oz)  - BMI 35.94 kg/m??   Appearance: well-nourished and no acute distress   Affect: alert and cooperative    Gait: deferred    L-spine Palpation: deferred    Nerve Tension Signs: deferred    Right Hip/Knee:   Restricted internal rotation ROM. Groin/hip pain with internal rotation/loading.    - replicates back pain   deferred  Left Hip/Knee:   Restricted internal rotation ROM. Groin/hip pain with internal rotation/loading.    - replicates back pain   deferred    Skin/Extremities: Normal skin in the lumbar spine. and Normal skin in bilat feet.      Motor R L  Reflexes R L   IP 5 5  Patellar 1-2+ 1-2+   Quad 5 5  Achilles 1-2+ 1-2+       Pathologic R L   TA 5 In ankle cast  Hoffmann's Deferred Deferred   EHL 5 5 Clonus neg. neg.   GS 5 In ankle cast  Babinski neg. neg.          MEDICAL DECISION MAKING    No results found.    Test Results:  Imaging:     Lumbar Spine Xrays:   Date:Recent  Impression: Trace retrolisthesis at L5-S1.  ??  Otherwise no evidence of acute osseous abnormality.    Lumbar Spine MRI:   Date:Recent  Impression: Mild to moderate degenerative changes most pronounced at L5-S1, where there is a disc protrusion abutting the traversing S1 nerve roots. Mild narrowing of the left L4-L5 and bilateral L5-S1 neural foramina.      I, Dr. Francesca Jewett, MD, personally interpreted the images. Images were reviewed with the patient on  the PACS monitor. The available reports were reviewed.    Labs:  Lab Results   Component Value Date    A1C 5.3 12/02/2020         Discussion:  ?? Clinical findings, diagnostic/treatment options, and plan were discussed with the patient.  ?? Conservative Care - Options discussed.  ?? Non-Surgical - Discussed that risks/potential sequelae of surgery outweigh potential benefits.    E&M Coding:    MEDICAL DECISION MAKING (level of service defined by 2/3 elements)     Number/Complexity of Problems Addressed 1 acute illness with systemic symptoms (99204/99214)   Amount/Complexity of Data to be Reviewed/Analyzed Discussion of management or test interpretation with external physician/other qualified health care professional/appropriate source (99204/99214)   Risk of Complications/Morbidity/Mortality of Management Decision for MAJOR Surgery (90-day global) WITH Risk Factors (99205/99215)     Or TIME     Total Time for E/M Services on the Date of Encounter [phone] - I personally spent 40-54 minutes face-to-face and non-face-to-face in the care of this patient, excluding time spent during separately reported procedures, but including all pre, intra, and post visit time on the date of service.          cc: Maslow, Gretel Acre, PA, Petra Kuba, MD    ______________________________________________________________________    Documentation assistance was provided by Lyn Records, Scribe, on 05/10/2021 at 4:26 PM for Dr. Jeanella Flattery, MD     ----------------------------------------------------------------------------------------------------------------------  May 10, 2021 4:26 PM. Documentation assistance provided by the Scribe. I was present during the time the encounter was recorded. The information recorded by the Scribe was done at my direction and has been reviewed and validated by me.  ----------------------------------------------------------------------------------------------------------------------

## 2021-05-11 NOTE — Unmapped (Signed)
[facility] Administered Medication Refill Coordination Note      NAME:Romi Albina Gosney DOB: 11/23/1990      Medication: Abilify Maintena    Day Supply: 28 days      SHIPPING      Next delivery from [facility] [phone]) to [facility] for Maurina Chyenne Sobczak is scheduled for n/a.    Clinic contact: Javier Docker    Patient's next nurse visit for administration: n/a clinic denied refill @ this time advised will f/u in 3 weeks.    We will follow up with clinic monthly for standard refill processing and delivery.      Ryna Beckstrom Samella Parr  Specialty Pharmacy Technician

## 2021-05-12 NOTE — Unmapped (Signed)
Recent:   What is the date of your last related visit?  NA  Related acute medications Rx'd: NA  Home treatment tried:  NA      Relevant:   Allergies: Oxycontin [oxycodone], Shellfish containing products, and Ibuprofen  Medications:    Health History: DM, HTN  Weight: NA      [place] [facility] patients only:  What was the date of your last cancer treatment (mm/dd/yy)?: NA  Was the treatment oral or infusion?: NA  Are you currently on TVEC (yes/no)?: NA   @0843  Returned call to patient to ensure 911 was in route and she confirmed they were.  Reason for Disposition  ??? Chest pain lasting longer than 5 minutes and ANY of the following:* Over 63 years old* Over 50 years old and at least one cardiac risk factor (e.g., diabetes mellitus, high blood pressure, high cholesterol, smoker, or strong family history of heart disease)* History of heart disease (i.e., angina, heart attack, heart failure, bypass surgery, takes nitroglycerin)* Pain is crushing, pressure-like, or heavy    Answer Assessment - Initial Assessment Questions  1. LOCATION: Where does it hurt?        Left side of chest   2. RADIATION: Does the pain go anywhere else? (e.g., into neck, jaw, arms, back)      Back and left arm   3. ONSET: When did the chest pain begin? (Minutes, hours or days)      0100 today  4. PATTERN Does the pain come and go, or has it been constant since it started?  Does it get worse with exertion?      constant  5. DURATION: How long does it last (e.g., seconds, minutes, hours)        6. SEVERITY: How bad is the pain?  (e.g., Scale 1-10; mild, moderate, or severe)     - MILD (1-3): doesn't interfere with normal activities      - MODERATE (4-7): interferes with normal activities or awakens from sleep     - SEVERE (8-10): excruciating pain, unable to do any normal activities    Moderate   7. CARDIAC RISK FACTORS: Do you have any history of heart problems or risk factors for heart disease? (e.g., angina, prior heart attack; diabetes, high blood pressure, high cholesterol, smoker, or strong family history of heart disease)    DM, HTN  8. PULMONARY RISK FACTORS: Do you have any history of lung disease?  (e.g., blood clots in lung, asthma, emphysema, birth control pills)     Denies   9. CAUSE: What do you think is causing the chest pain?    Not asked  10. OTHER SYMPTOMS: Do you have any other symptoms? (e.g., dizziness, nausea, vomiting, sweating, fever, difficulty breathing, cough)       Dizziness, sweating  11. PREGNANCY: Is there any chance you are pregnant? When was your last menstrual period?      hysterectomy    Protocols used: CHEST PAIN-A-[place]

## 2021-06-07 NOTE — Unmapped (Signed)
Specialty Medication(s): Abilify     Ashley Dunlap has been dis-enrolled from the [facility] specialty pharmacy services due to per [facility] @ clinic Mo is no longer their pt requested to dis-enroll .    Additional information provided to the patient:      Antonietta Barcelona  [facility] Specialty Pharmacist

## 2021-08-24 MED ORDER — POLYMYXIN B SULFATE 10,000 UNIT-TRIMETHOPRIM 1 MG/ML EYE DROPS
Freq: Four times a day (QID) | OPHTHALMIC | 0 refills | 50 days | Status: CP
Start: 2021-08-24 — End: 2021-08-24

## 2021-08-24 MED ORDER — CIPROFLOXACIN 0.3 %-DEXAMETHASONE 0.1 % EAR DROPS,SUSPENSION
Freq: Two times a day (BID) | OTIC | 0 refills | 25 days | Status: CP
Start: 2021-08-24 — End: ?

## 2021-08-25 ENCOUNTER — Institutional Professional Consult (permissible substitution): Admit: 2021-08-25 | Discharge: 2021-08-25 | Payer: MEDICARE

## 2021-09-05 ENCOUNTER — Emergency Department: Admit: 2021-09-05 | Discharge: 2021-09-05 | Disposition: A | Discharge status: Home / Self Care | Payer: MEDICARE

## 2021-09-05 ENCOUNTER — Ambulatory Visit: Admit: 2021-09-05 | Discharge: 2021-09-06 | Payer: MEDICARE

## 2021-09-05 ENCOUNTER — Ambulatory Visit: Admit: 2021-09-05 | Discharge: 2021-09-05 | Disposition: A | Discharge status: Home / Self Care | Payer: MEDICARE

## 2021-09-05 DIAGNOSIS — R109 Unspecified abdominal pain: Principal | ICD-10-CM

## 2021-09-05 DIAGNOSIS — R112 Nausea with vomiting, unspecified: Principal | ICD-10-CM

## 2021-09-05 MED ORDER — ONDANSETRON 4 MG DISINTEGRATING TABLET
ORAL_TABLET | Freq: Three times a day (TID) | ORAL | 0 refills | 5 days | Status: CP | PRN
Start: 2021-09-05 — End: 2021-09-12

## 2021-09-07 ENCOUNTER — Telehealth: Admit: 2021-09-07 | Discharge: 2021-09-08 | Payer: MEDICARE | Attending: Family Medicine | Primary: Family Medicine

## 2021-09-07 DIAGNOSIS — D573 Sickle-cell trait: Principal | ICD-10-CM

## 2021-09-07 DIAGNOSIS — E70321 Tyrosinase positive oculocutaneous albinism: Principal | ICD-10-CM

## 2021-09-07 DIAGNOSIS — R112 Nausea with vomiting, unspecified: Principal | ICD-10-CM

## 2021-09-07 MED ORDER — ONDANSETRON 4 MG DISINTEGRATING TABLET
ORAL_TABLET | Freq: Three times a day (TID) | ORAL | 0 refills | 4 days | Status: CP | PRN
Start: 2021-09-07 — End: ?

## 2021-09-07 MED ORDER — BISACODYL 10 MG RECTAL SUPPOSITORY
Freq: Every day | RECTAL | 0 refills | 12 days | Status: CP
Start: 2021-09-07 — End: ?

## 2021-09-08 DIAGNOSIS — R112 Nausea with vomiting, unspecified: Principal | ICD-10-CM

## 2021-09-08 DIAGNOSIS — Z9884 Bariatric surgery status: Principal | ICD-10-CM

## 2021-10-06 ENCOUNTER — Ambulatory Visit: Admit: 2021-10-06 | Discharge: 2021-10-07 | Payer: MEDICARE | Attending: Family Medicine | Primary: Family Medicine

## 2021-10-06 MED ORDER — SUMATRIPTAN 50 MG TABLET
ORAL_TABLET | ORAL | 0 refills | 3 days | Status: CP | PRN
Start: 2021-10-06 — End: ?

## 2021-10-12 DIAGNOSIS — G43009 Migraine without aura, not intractable, without status migrainosus: Principal | ICD-10-CM

## 2021-10-12 MED ORDER — SUMATRIPTAN 50 MG TABLET
ORAL_TABLET | ORAL | 0 refills | 1 days | Status: CP | PRN
Start: 2021-10-12 — End: ?

## 2021-10-24 ENCOUNTER — Telehealth: Admit: 2021-10-24 | Discharge: 2021-10-25 | Payer: MEDICARE

## 2021-10-24 DIAGNOSIS — G43001 Migraine without aura, not intractable, with status migrainosus: Principal | ICD-10-CM

## 2021-10-25 ENCOUNTER — Ambulatory Visit: Admit: 2021-10-25 | Discharge: 2021-10-26 | Payer: MEDICARE

## 2021-10-25 MED ORDER — CYCLOBENZAPRINE 5 MG TABLET
ORAL_TABLET | Freq: Three times a day (TID) | ORAL | 0 refills | 10 days | Status: CP | PRN
Start: 2021-10-25 — End: ?

## 2022-01-06 ENCOUNTER — Ambulatory Visit: Admit: 2022-01-06 | Payer: MEDICARE

## 2022-03-27 ENCOUNTER — Telehealth: Admit: 2022-03-27 | Discharge: 2022-03-28 | Payer: MEDICARE | Attending: Family | Primary: Family

## 2022-03-27 DIAGNOSIS — G43009 Migraine without aura, not intractable, without status migrainosus: Principal | ICD-10-CM

## 2022-03-27 MED ORDER — CYCLOBENZAPRINE 5 MG TABLET
ORAL_TABLET | Freq: Three times a day (TID) | ORAL | 0 refills | 10 days | Status: CP | PRN
Start: 2022-03-27 — End: ?

## 2022-04-18 ENCOUNTER — Ambulatory Visit
Admit: 2022-04-18 | Discharge: 2022-04-19 | Payer: MEDICARE | Attending: Student in an Organized Health Care Education/Training Program | Primary: Student in an Organized Health Care Education/Training Program

## 2022-05-08 ENCOUNTER — Ambulatory Visit: Admit: 2022-05-08 | Payer: MEDICARE

## 2022-07-30 ENCOUNTER — Telehealth: Admit: 2022-07-30 | Discharge: 2022-07-31 | Payer: MEDICARE

## 2022-07-30 DIAGNOSIS — B354 Tinea corporis: Principal | ICD-10-CM

## 2022-07-30 MED ORDER — MICONAZOLE NITRATE 2 % TOPICAL CREAM
0 refills | 0 days | Status: CP
Start: 2022-07-30 — End: 2023-07-30

## 2022-08-03 ENCOUNTER — Ambulatory Visit: Admit: 2022-08-03 | Payer: MEDICARE | Attending: Family Medicine | Primary: Family Medicine

## 2022-08-25 ENCOUNTER — Ambulatory Visit
Admit: 2022-08-25 | Discharge: 2022-08-25 | Disposition: A | Discharge status: Home / Self Care | Payer: MEDICARE | Attending: Emergency Medicine

## 2022-08-25 ENCOUNTER — Emergency Department
Admit: 2022-08-25 | Discharge: 2022-08-25 | Disposition: A | Discharge status: Home / Self Care | Payer: MEDICARE | Attending: Emergency Medicine

## 2022-08-25 DIAGNOSIS — K5900 Constipation, unspecified: Principal | ICD-10-CM

## 2022-08-25 DIAGNOSIS — R109 Unspecified abdominal pain: Principal | ICD-10-CM

## 2022-08-25 DIAGNOSIS — R112 Nausea with vomiting, unspecified: Principal | ICD-10-CM

## 2022-08-25 MED ORDER — POLYETHYLENE GLYCOL 3350 17 GRAM ORAL POWDER PACKET
PACK | Freq: Every day | ORAL | 0 refills | 30 days | Status: CP
Start: 2022-08-25 — End: 2022-09-24

## 2022-09-05 ENCOUNTER — Ambulatory Visit: Admit: 2022-09-05 | Discharge: 2022-09-06 | Payer: MEDICARE

## 2022-09-05 MED ORDER — NAPROXEN 500 MG TABLET
ORAL_TABLET | Freq: Two times a day (BID) | ORAL | 0 refills | 10 days | Status: CP
Start: 2022-09-05 — End: 2022-09-15

## 2022-10-26 ENCOUNTER — Telehealth: Admit: 2022-10-26 | Discharge: 2022-10-27 | Payer: MEDICARE

## 2022-10-26 DIAGNOSIS — J392 Other diseases of pharynx: Principal | ICD-10-CM

## 2022-11-01 IMAGING — CT CT ABD-PELV W/ CM
2 of 5 series · 16 of 46 positions shown, 18 images · IV contrast (APPLIED)
Comparison: None.

CLINICAL DATA: Acute nonlocalized abdominal pain. Pelvic and
vaginal pain over the last week. Partial hysterectomy in [REDACTED].

EXAM:
CT ABDOMEN AND PELVIS WITH CONTRAST
TECHNIQUE: Multidetector CT imaging of the abdomen and pelvis was performed
using the standard protocol following bolus administration of
intravenous contrast.
CONTRAST:  75mL OMNIPAQUE IOHEXOL 350 MG/ML SOLN

[Series 2: axial st · axial · 0.71mm/px · z∈[-495,-80]mm · 13 of 96 slices shown, 15 images]
[im 7/96  soft-tissue]
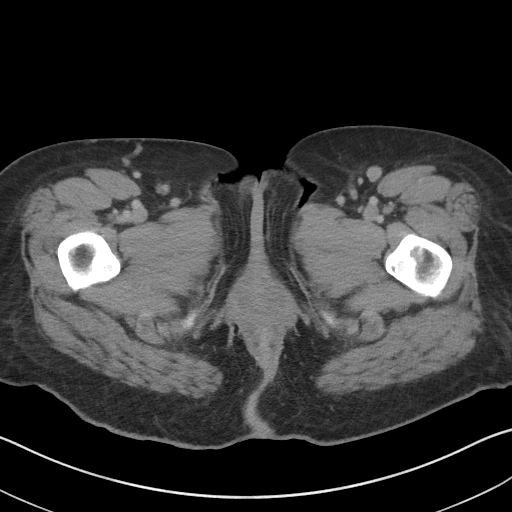
[im 7/96  bone]
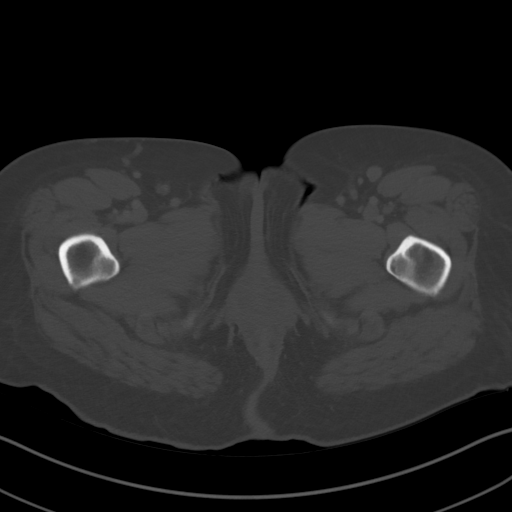
[im 14/96  soft-tissue]
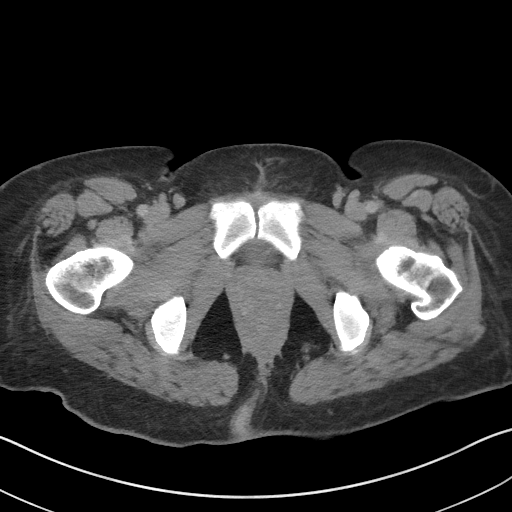
[im 21/96  soft-tissue]
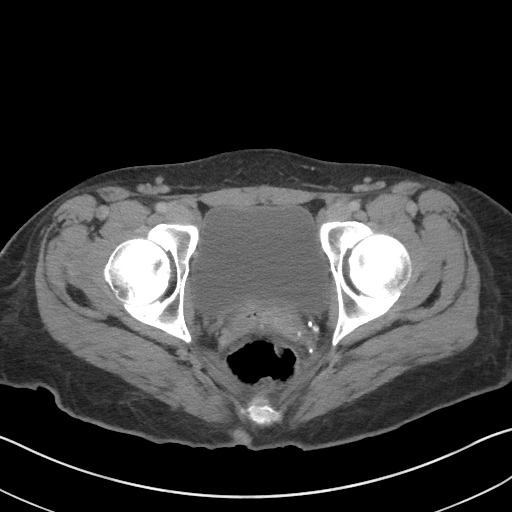
[im 28/96  soft-tissue]
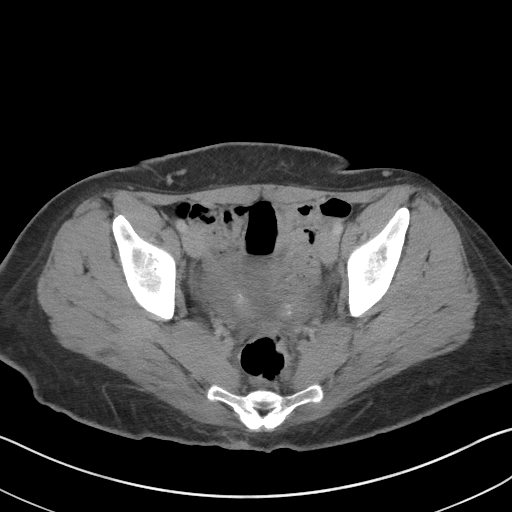
[im 34/96  soft-tissue]
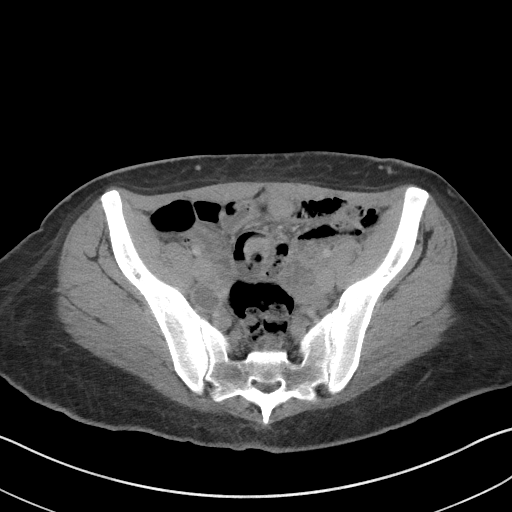
[im 41/96  soft-tissue]
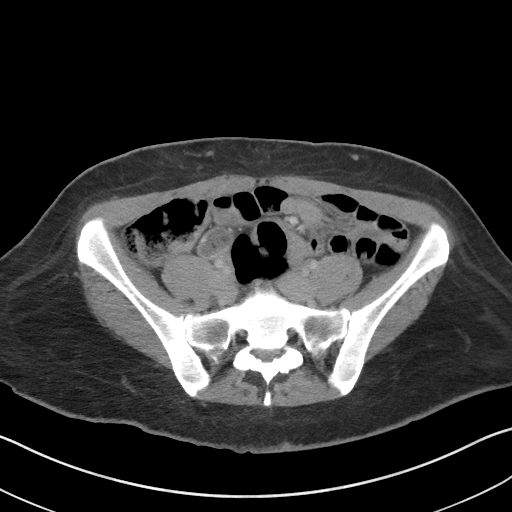
[im 48/96  soft-tissue]
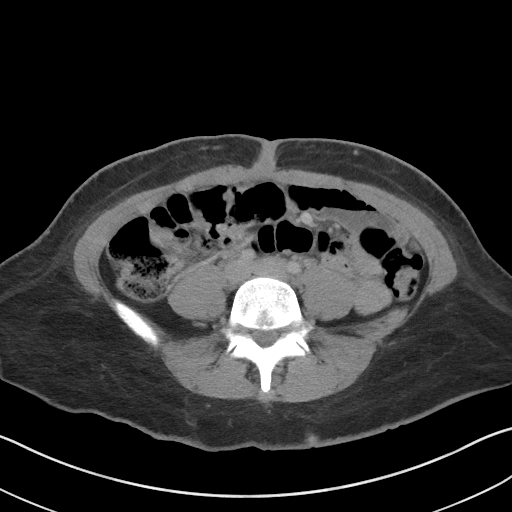
[im 55/96  soft-tissue]
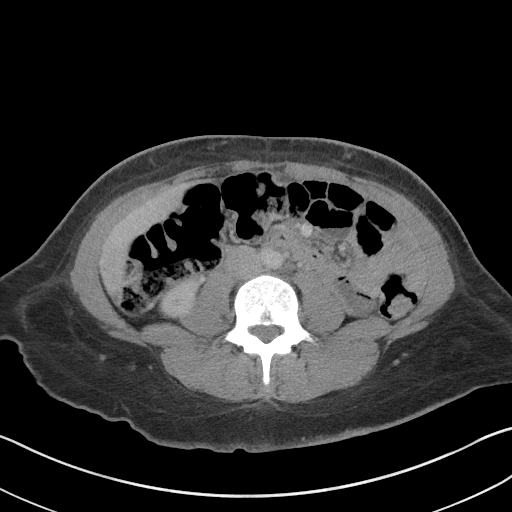
[im 62/96  soft-tissue]
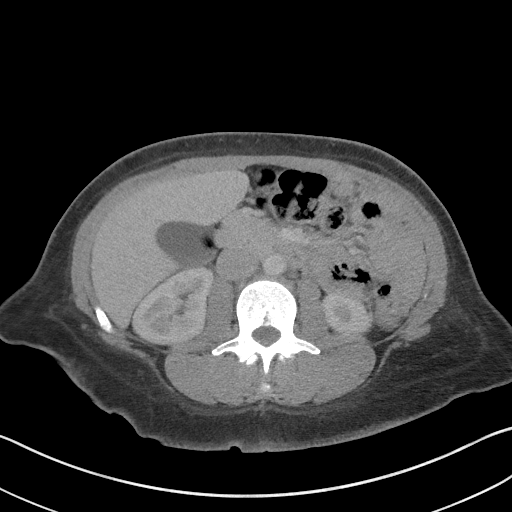
[im 62/96  bone]
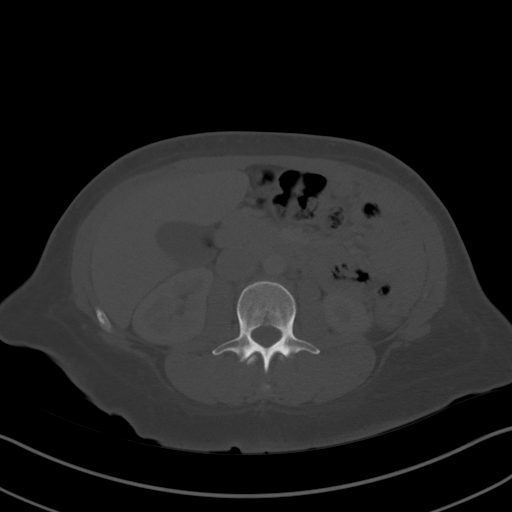
[im 68/96  soft-tissue]
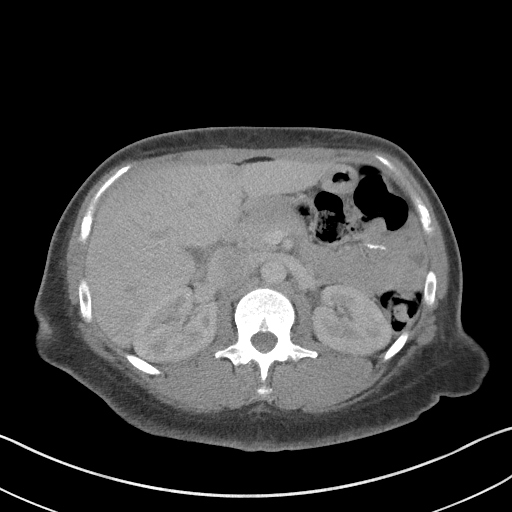
[im 75/96  soft-tissue]
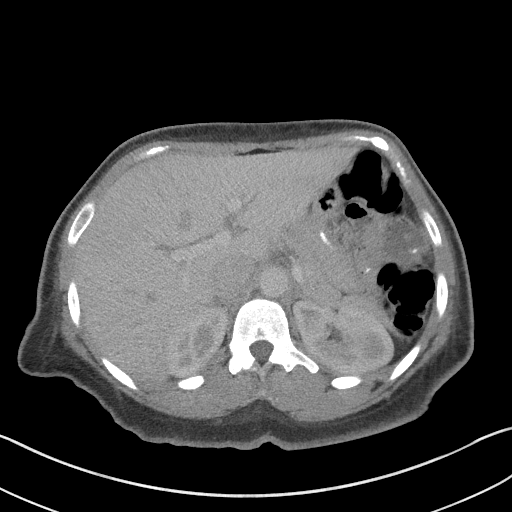
[im 82/96  soft-tissue]
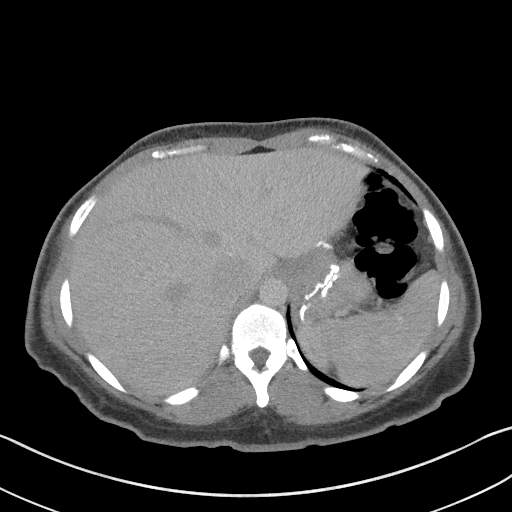
[im 89/96  soft-tissue]
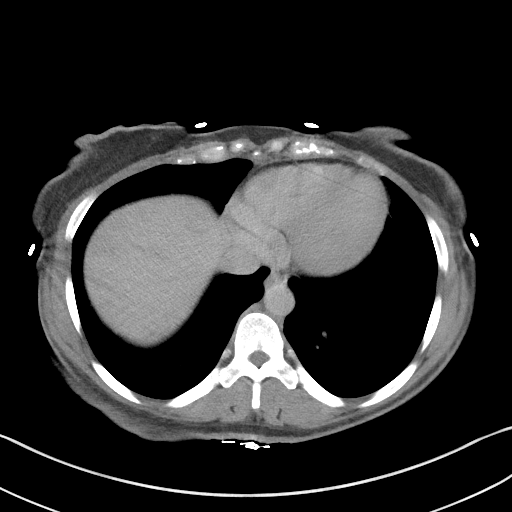

[Series 5: coronal st · coronal · 0.68mm/px · 3 of 81 slices shown]
[im 27/81  soft-tissue]
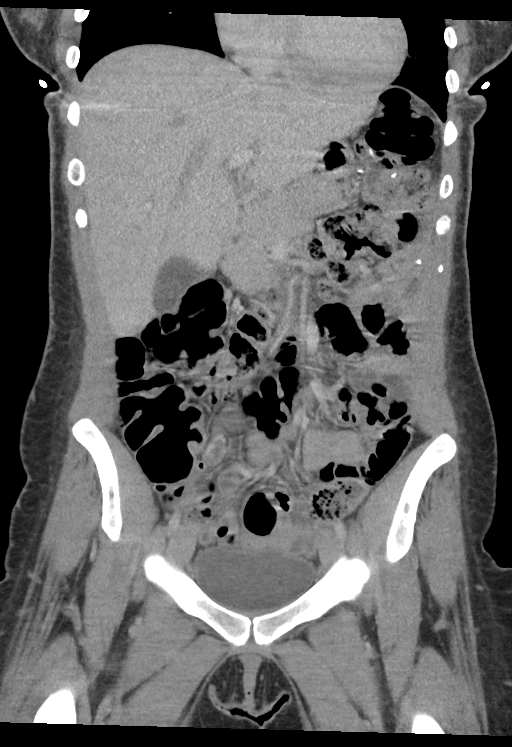
[im 36/81  soft-tissue]
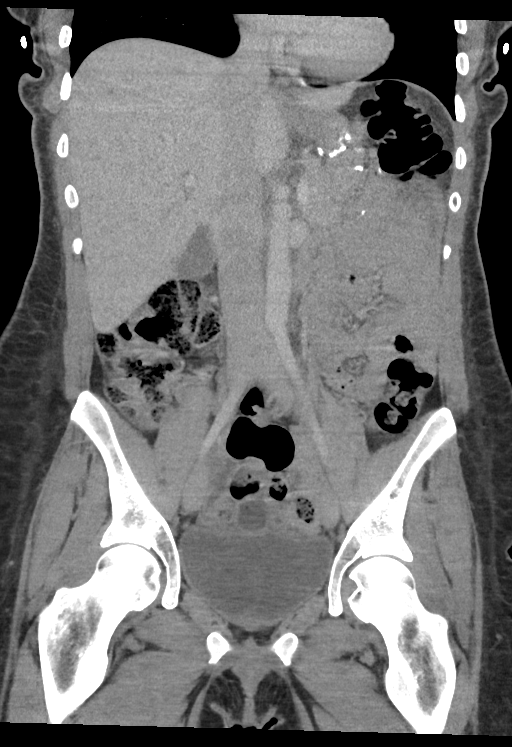
[im 45/81  soft-tissue]
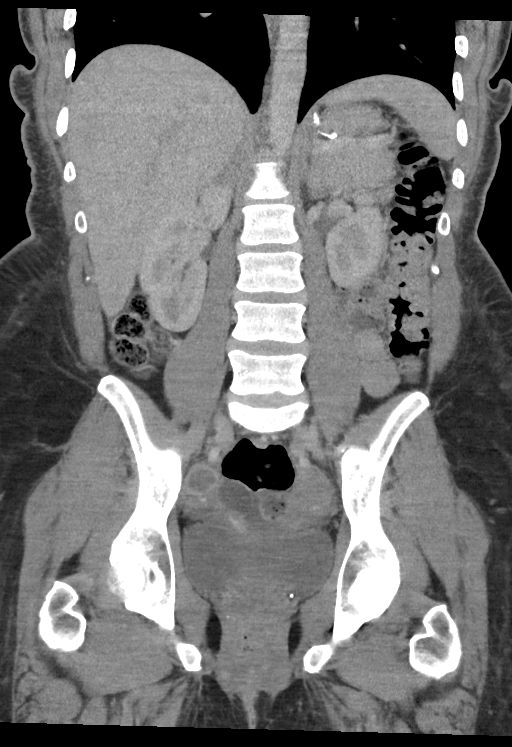

[16 of 46 positions shown; findings below may reference images not displayed]

FINDINGS: Lower chest: Normal

Hepatobiliary: Liver parenchyma appears normal. No calcified
gallstones.

Pancreas: Normal

Spleen: Normal

Adrenals/Urinary Tract: Adrenal glands are normal. Kidneys are
normal. No cyst, mass, stone or hydronephrosis. Bladder is normal.

Stomach/Bowel: Previous bariatric surgery. No complicating feature
seen relative to that. Small bowel appears unremarkable. There is a
moderate amount of gas and stool in the colon but not outside of the
range of normal. No sign of bowel obstruction.

Vascular/Lymphatic: Aorta and IVC are normal. No retroperitoneal
adenopathy.

Reproductive: Previous hysterectomy or partial hysterectomy. 2 cyst
associated with the right ovary consistent with follicular cysts,
the largest measuring 2.5 cm in diameter.

Other: No free fluid or air.

Musculoskeletal: Normal.
IMPRESSION: No complication seen in this patient who is had recent hysterectomy
or partial hysterectomy. No evidence of abscess or free fluid/free
air.

Two follicular cyst associated with the right ovary, the largest
cm. No follow-up imaging is recommended.

No other abnormality seen to explain the clinical presentation.

## 2022-12-02 ENCOUNTER — Ambulatory Visit
Admit: 2022-12-02 | Discharge: 2022-12-04 | Disposition: A | Discharge status: Home / Self Care | Payer: MEDICARE | Admitting: Psychiatry

## 2022-12-04 MED ORDER — HYDROXYZINE HCL 50 MG TABLET
ORAL_TABLET | Freq: Four times a day (QID) | ORAL | 0 refills | 8 days | Status: CP | PRN
Start: 2022-12-04 — End: ?
  Filled 2022-12-04: qty 30, 8d supply, fill #0

## 2022-12-04 MED ORDER — BISACODYL 10 MG RECTAL SUPPOSITORY
Freq: Every day | RECTAL | 0 refills | 30.00000 days | Status: CN
Start: 2022-12-04 — End: 2023-01-03

## 2022-12-04 MED ORDER — ARIPIPRAZOLE 10 MG TABLET
ORAL_TABLET | Freq: Every evening | ORAL | 0 refills | 30 days | Status: CP
Start: 2022-12-04 — End: 2023-01-03
  Filled 2022-12-04: qty 30, 30d supply, fill #0

## 2022-12-04 MED ORDER — ONDANSETRON 4 MG DISINTEGRATING TABLET
ORAL_TABLET | Freq: Three times a day (TID) | 0 refills | 10 days | Status: CP | PRN
Start: 2022-12-04 — End: 2023-01-03
  Filled 2022-12-04: qty 30, 10d supply, fill #0

## 2022-12-10 ENCOUNTER — Emergency Department: Admit: 2022-12-10 | Discharge: 2022-12-10 | Disposition: A | Discharge status: Home / Self Care | Payer: MEDICARE

## 2022-12-10 ENCOUNTER — Ambulatory Visit: Admit: 2022-12-10 | Discharge: 2022-12-10 | Disposition: A | Discharge status: Home / Self Care | Payer: MEDICARE

## 2022-12-10 MED ORDER — DICYCLOMINE 20 MG TABLET
ORAL_TABLET | Freq: Two times a day (BID) | ORAL | 0 refills | 10 days | Status: CP
Start: 2022-12-10 — End: 2022-12-20

## 2022-12-10 MED ORDER — METRONIDAZOLE 500 MG TABLET
ORAL_TABLET | Freq: Two times a day (BID) | ORAL | 0 refills | 7 days | Status: CP
Start: 2022-12-10 — End: 2022-12-17

## 2022-12-10 MED ORDER — PROMETHAZINE 25 MG TABLET
ORAL_TABLET | Freq: Four times a day (QID) | ORAL | 0 refills | 8 days | Status: CP | PRN
Start: 2022-12-10 — End: 2022-12-18

## 2023-01-01 ENCOUNTER — Other Ambulatory Visit: Payer: Self-pay

## 2023-01-01 ENCOUNTER — Emergency Department
Admission: EM | Admit: 2023-01-01 | Discharge: 2023-01-01 | Disposition: A | Discharge status: Home / Self Care | Payer: 59 | Attending: Emergency Medicine | Admitting: Emergency Medicine

## 2023-01-01 ENCOUNTER — Emergency Department: Payer: 59

## 2023-01-01 DIAGNOSIS — R531 Weakness: Secondary | ICD-10-CM | POA: Diagnosis present

## 2023-01-01 DIAGNOSIS — E119 Type 2 diabetes mellitus without complications: Secondary | ICD-10-CM | POA: Insufficient documentation

## 2023-01-01 DIAGNOSIS — T85598A Other mechanical complication of other gastrointestinal prosthetic devices, implants and grafts, initial encounter: Secondary | ICD-10-CM | POA: Diagnosis not present

## 2023-01-01 LAB — CBC WITH DIFFERENTIAL/PLATELET
Abs Immature Granulocytes: 0.01 10*3/uL (ref 0.00–0.07)
Basophils Absolute: 0 10*3/uL (ref 0.0–0.1)
Basophils Relative: 0 %
Eosinophils Absolute: 0.1 10*3/uL (ref 0.0–0.5)
Eosinophils Relative: 1 %
HCT: 34.4 % — ABNORMAL LOW (ref 36.0–46.0)
Hemoglobin: 10.9 g/dL — ABNORMAL LOW (ref 12.0–15.0)
Immature Granulocytes: 0 %
Lymphocytes Relative: 40 %
Lymphs Abs: 2.6 10*3/uL (ref 0.7–4.0)
MCH: 30.8 pg (ref 26.0–34.0)
MCHC: 31.7 g/dL (ref 30.0–36.0)
MCV: 97.2 fL (ref 80.0–100.0)
Monocytes Absolute: 0.5 10*3/uL (ref 0.1–1.0)
Monocytes Relative: 8 %
Neutro Abs: 3.2 10*3/uL (ref 1.7–7.7)
Neutrophils Relative %: 51 %
Platelets: 539 10*3/uL — ABNORMAL HIGH (ref 150–400)
RBC: 3.54 MIL/uL — ABNORMAL LOW (ref 3.87–5.11)
RDW: 13.8 % (ref 11.5–15.5)
WBC: 6.4 10*3/uL (ref 4.0–10.5)
nRBC: 0 % (ref 0.0–0.2)

## 2023-01-01 LAB — BASIC METABOLIC PANEL
Anion gap: 9 (ref 5–15)
BUN: 11 mg/dL (ref 6–20)
CO2: 24 mmol/L (ref 22–32)
Calcium: 8.7 mg/dL — ABNORMAL LOW (ref 8.9–10.3)
Chloride: 104 mmol/L (ref 98–111)
Creatinine, Ser: 0.76 mg/dL (ref 0.44–1.00)
GFR, Estimated: >60 mL/min (ref >60)
Glucose, Bld: 92 mg/dL (ref 70–99)
Potassium: 5 mmol/L (ref 3.5–5.1)
Sodium: 137 mmol/L (ref 135–145)

## 2023-01-01 LAB — MAGNESIUM: Magnesium: 2.6 mg/dL — ABNORMAL HIGH (ref 1.7–2.4)

## 2023-01-01 MED ORDER — LACTATED RINGERS IV BOLUS
1000.0000 mL | Freq: Once | INTRAVENOUS | Status: AC
  Administered 2023-01-01: 1000 mL via INTRAVENOUS

## 2023-01-01 NOTE — ED Triage Notes (Signed)
Patient states she had a feeding tube placed in October of 2024; states it has been clogged for 4 days so she has been unable to receive nutrition other that sips of water.

## 2023-01-01 NOTE — ED Notes (Signed)
Pt's NG tube was flushed with coke and a small syringe. Initial resistance was there but the tubing was able to be flushed and pt is able to use tube feeding. Pt tolerated procedure well.

## 2023-01-01 NOTE — ED Provider Notes (Signed)
[facility] Provider Note    Event Date/Time   First MD Initiated Contact with Patient 01/01/23 1948     (approximate)   History   Chief Complaint Weakness   HPI  Kristen Griffith is a 32 y.o. 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  Patient reports that she was admitted to [facility] last month due to increasing abdominal pain and difficulty with oral intake.  She subsequently had nasogastric feeding tube placed, which she has been using for much of her nutrition since then.  She states she is able to take small bites of food and sips of water, but has been told to do a tube feed each night by her bariatric surgeon.  She states that the tube has become clogged for the past 4 days and she has been unable to use it.     Physical Exam   Triage Vital Signs: ED Triage Vitals  Encounter Vitals Group     BP 01/01/23 1711 122/79     Systolic BP Percentile --      Diastolic BP Percentile --      Pulse Rate 01/01/23 1711 83     Resp 01/01/23 1711 20     Temp 01/01/23 1711 98.1 F (36.7 C)     Temp Source 01/01/23 1711 Oral     SpO2 01/01/23 1711 100 %     Weight --      Height 01/01/23 1708 5\' 9"  (1.753 m)     Head Circumference --      Peak Flow --      Pain Score 01/01/23 1708 0     Pain Loc --      Pain Education --      Exclude from Growth Chart --     Most recent vital signs: Vitals:   01/01/23 1711 01/01/23 1956  BP: 122/79 124/89  Pulse: 83 77  Resp: 20 20  Temp: 98.1 F (36.7 C)   SpO2: 100% 100%    Constitutional: Alert and oriented. Eyes: Conjunctivae are normal. Head: Atraumatic. Nose: No congestion/rhinnorhea.  Nasogastric tube in place. Mouth/Throat: Mucous membranes are moist.  Cardiovascular: Normal rate, regular rhythm. Grossly normal heart sounds.  2+ radial pulses bilaterally. Respiratory: Normal  respiratory effort.  No retractions. Lungs CTAB. Gastrointestinal: Soft and nontender. No distention. Musculoskeletal: No lower extremity tenderness nor edema.  Neurologic:  Normal speech and language. No gross focal neurologic deficits are appreciated.    ED Results / Procedures / Treatments   Labs (all labs ordered are listed, but only abnormal results are displayed) Labs Reviewed  CBC WITH DIFFERENTIAL/PLATELET - Abnormal; Notable for the following components:      Result Value   RBC 3.54 (*)    Hemoglobin 10.9 (*)    HCT 34.4 (*)    Platelets 539 (*)    All other components within normal limits  BASIC METABOLIC PANEL - Abnormal; Notable for the following components:   Calcium 8.7 (*)    All other components within normal limits  MAGNESIUM - Abnormal; Notable for the following components:   Magnesium 2.6 (*)    All other components within normal limits   RADIOLOGY Abdominal x-ray reviewed and interpreted by me with enteric tube in the lower abdomen.  PROCEDURES:  Critical Care performed: No  Procedures   MEDICATIONS ORDERED IN ED: Medications  lactated ringers bolus  1,000 mL (1,000 mLs Intravenous New Bag/Given 01/01/23 2017)     IMPRESSION / MDM / ASSESSMENT AND PLAN / ED COURSE  I reviewed the triage vital signs and the nursing notes.                              32 y.o. female with past medical history of diabetes, albinism, and gastric bypass status post revision who presents to the ED with clogged feeding tube for the past 4 days.  Patient's presentation is most consistent with acute presentation with potential threat to life or bodily function.  Differential diagnosis includes, but is not limited to, dehydration, electrolyte abnormality, AKI, malnutrition, displaced feeding tube, clogged feeding tube.  Patient nontoxic-appearing and in no acute distress, vital signs are unremarkable.  Her abdominal exam is benign and she reports that pain in her abdomen is  overall improved from her recent admission.  She continues to have difficulty with taking food orally, but can take small bites of solids as well as sips of water.  We will attempt to unclog her nasogastric tube and screen labs.  Labs without significant anemia, leukocytosis, electrolyte abnormality, or AKI.  Magnesium level slightly elevated.  Nursing staff was able to clear the clog in her nasogastric feeding tube and patient is appropriate for discharge home, was encouraged to follow-up with her bariatric team in [place].  She was counseled to return to the ED for new or worsening symptoms, patient agrees with plan.      FINAL CLINICAL IMPRESSION(S) / ED DIAGNOSES   Final diagnoses:  Clogged feeding tube     Rx / DC Orders   ED Discharge Orders     None        Note:  This document was prepared using Dragon voice recognition software and may include unintentional dictation errors.   Chesley Noon, MD 01/01/23 2044

## 2023-01-02 ENCOUNTER — Telehealth: Admit: 2023-01-02 | Discharge: 2023-01-03 | Payer: MEDICARE

## 2023-01-02 DIAGNOSIS — B9689 Other specified bacterial agents as the cause of diseases classified elsewhere: Principal | ICD-10-CM

## 2023-01-02 DIAGNOSIS — J329 Chronic sinusitis, unspecified: Principal | ICD-10-CM

## 2023-01-02 MED ORDER — AMOXICILLIN 875 MG-POTASSIUM CLAVULANATE 125 MG TABLET
ORAL_TABLET | Freq: Two times a day (BID) | ORAL | 0 refills | 7 days | Status: CP
Start: 2023-01-02 — End: 2023-01-09

## 2023-02-05 ENCOUNTER — Ambulatory Visit
Admit: 2023-02-05 | Discharge: 2023-02-06 | Payer: MEDICARE | Attending: Student in an Organized Health Care Education/Training Program | Primary: Student in an Organized Health Care Education/Training Program

## 2023-02-05 MED ORDER — GABAPENTIN 100 MG CAPSULE
ORAL_CAPSULE | Freq: Three times a day (TID) | ORAL | 0 refills | 90.00 days | Status: CP
Start: 2023-02-05 — End: 2023-05-06

## 2023-02-06 ENCOUNTER — Emergency Department: Admit: 2023-02-06 | Discharge: 2023-02-06 | Disposition: A | Discharge status: Home / Self Care | Payer: MEDICARE

## 2023-02-06 ENCOUNTER — Ambulatory Visit: Admit: 2023-02-06 | Discharge: 2023-02-06 | Disposition: A | Discharge status: Home / Self Care | Payer: MEDICARE

## 2023-02-06 DIAGNOSIS — R079 Chest pain, unspecified: Principal | ICD-10-CM

## 2023-02-06 DIAGNOSIS — R519 Acute nonintractable headache, unspecified headache type: Principal | ICD-10-CM

## 2023-02-13 ENCOUNTER — Ambulatory Visit
Admit: 2023-02-13 | Discharge: 2023-02-14 | Payer: MEDICARE | Attending: Student in an Organized Health Care Education/Training Program | Primary: Student in an Organized Health Care Education/Training Program

## 2023-02-13 DIAGNOSIS — G8929 Other chronic pain: Principal | ICD-10-CM

## 2023-02-13 DIAGNOSIS — M5442 Lumbago with sciatica, left side: Principal | ICD-10-CM

## 2023-02-13 DIAGNOSIS — M5441 Lumbago with sciatica, right side: Principal | ICD-10-CM

## 2023-02-13 DIAGNOSIS — M5412 Radiculopathy, cervical region: Principal | ICD-10-CM

## 2023-02-13 MED ORDER — HYDROCODONE 5 MG-ACETAMINOPHEN 325 MG TABLET
ORAL_TABLET | Freq: Two times a day (BID) | ORAL | 0 refills | 21.00 days | Status: CP | PRN
Start: 2023-02-13 — End: 2023-03-06

## 2023-03-06 ENCOUNTER — Ambulatory Visit: Admit: 2023-03-06 | Payer: MEDICARE

## 2023-04-08 DIAGNOSIS — R6889 Other general symptoms and signs: Principal | ICD-10-CM

## 2023-04-08 DIAGNOSIS — J069 Acute upper respiratory infection, unspecified: Principal | ICD-10-CM

## 2023-04-08 MED ORDER — BALOXAVIR MARBOXIL 40 MG TABLET
ORAL_TABLET | Freq: Once | ORAL | 0 refills | 1.00 days | Status: CP
Start: 2023-04-08 — End: 2023-04-08

## 2023-04-08 MED ORDER — IPRATROPIUM BROMIDE 21 MCG (0.03 %) NASAL SPRAY
Freq: Two times a day (BID) | NASAL | 0 refills | 30.00 days | Status: CP
Start: 2023-04-08 — End: 2023-05-08

## 2023-04-08 MED ORDER — LEVOCETIRIZINE 5 MG TABLET
ORAL_TABLET | Freq: Every evening | ORAL | 0 refills | 30.00 days | Status: CP
Start: 2023-04-08 — End: 2023-05-08

## 2023-04-09 ENCOUNTER — Encounter: Admit: 2023-04-09 | Discharge: 2023-04-09 | Payer: MEDICARE

## 2023-04-16 ENCOUNTER — Ambulatory Visit
Admit: 2023-04-16 | Discharge: 2023-04-17 | Payer: MEDICARE | Attending: Student in an Organized Health Care Education/Training Program | Primary: Student in an Organized Health Care Education/Training Program

## 2023-04-23 ENCOUNTER — Ambulatory Visit
Admit: 2023-04-23 | Payer: MEDICARE | Attending: Student in an Organized Health Care Education/Training Program | Primary: Student in an Organized Health Care Education/Training Program

## 2023-04-24 ENCOUNTER — Inpatient Hospital Stay
Admit: 2023-04-24 | Discharge: 2023-04-27 | Disposition: A | Discharge status: Home Health | Payer: MEDICARE | Admitting: Family Medicine

## 2023-04-24 ENCOUNTER — Ambulatory Visit
Admit: 2023-04-24 | Discharge: 2023-04-27 | Disposition: A | Discharge status: Home Health | Payer: MEDICARE | Admitting: Family Medicine

## 2023-04-24 MED ORDER — PREGABALIN 75 MG CAPSULE
ORAL_CAPSULE | Freq: Two times a day (BID) | ORAL | 0 refills | 7.00 days | Status: CP
Start: 2023-04-24 — End: 2023-05-01

## 2023-04-26 MED ORDER — PREGABALIN 75 MG CAPSULE
ORAL_CAPSULE | Freq: Three times a day (TID) | ORAL | 0 refills | 30.00 days
Start: 2023-04-26 — End: 2023-05-26

## 2023-04-26 MED ORDER — ARIPIPRAZOLE 20 MG TABLET
ORAL_TABLET | Freq: Every evening | ORAL | 0 refills | 30.00 days
Start: 2023-04-26 — End: 2023-05-26

## 2023-04-26 MED ORDER — POLYETHYLENE GLYCOL 3350 17 GRAM ORAL POWDER PACKET
PACK | Freq: Two times a day (BID) | ORAL | 0 refills | 30.00 days
Start: 2023-04-26 — End: 2023-05-26

## 2023-04-26 MED ORDER — METOCLOPRAMIDE 10 MG TABLET
ORAL_TABLET | Freq: Three times a day (TID) | ORAL | 0 refills | 10.00 days | PRN
Start: 2023-04-26 — End: 2023-05-26

## 2023-04-26 MED ORDER — TIZANIDINE 2 MG TABLET
ORAL_TABLET | Freq: Four times a day (QID) | ORAL | 0 refills | 8.00 days | PRN
Start: 2023-04-26 — End: ?

## 2023-04-26 MED ORDER — SENNOSIDES 8.6 MG TABLET
ORAL_TABLET | Freq: Every evening | ORAL | 0 refills | 30.00 days
Start: 2023-04-26 — End: 2023-05-26

## 2023-04-27 MED ORDER — POLYETHYLENE GLYCOL 3350 17 GRAM/DOSE ORAL POWDER
Freq: Two times a day (BID) | ORAL | 0 refills | 30.00 days | Status: CP
Start: 2023-04-27 — End: ?

## 2023-04-27 MED ORDER — METOCLOPRAMIDE 10 MG TABLET
ORAL_TABLET | Freq: Three times a day (TID) | ORAL | 0 refills | 10.00 days | Status: CP | PRN
Start: 2023-04-27 — End: ?
  Filled 2023-04-27: qty 30, 10d supply, fill #0

## 2023-04-27 MED ORDER — POLYETHYLENE GLYCOL 3350 17 GRAM ORAL POWDER PACKET
PACK | Freq: Two times a day (BID) | ORAL | 0 refills | 30.00 days | Status: CN
Start: 2023-04-27 — End: 2023-05-27

## 2023-04-27 MED ORDER — PREGABALIN 75 MG CAPSULE
ORAL_CAPSULE | Freq: Two times a day (BID) | ORAL | 0 refills | 45.00 days | Status: CP
Start: 2023-04-27 — End: ?
  Filled 2023-04-27: qty 90, 45d supply, fill #0

## 2023-04-27 MED ORDER — SUMATRIPTAN 25 MG TABLET
ORAL_TABLET | ORAL | 0 refills | 1.00 days | Status: CN | PRN
Start: 2023-04-27 — End: 2024-04-26

## 2023-04-27 MED ORDER — ACETAMINOPHEN 500 MG TABLET
ORAL_TABLET | Freq: Four times a day (QID) | ORAL | 0 refills | 4.00 days | Status: CP | PRN
Start: 2023-04-27 — End: ?

## 2023-04-27 MED ORDER — TIZANIDINE 2 MG TABLET
ORAL_TABLET | Freq: Four times a day (QID) | ORAL | 0 refills | 8.00 days | Status: CP | PRN
Start: 2023-04-27 — End: ?
  Filled 2023-04-27: qty 30, 8d supply, fill #0

## 2023-04-27 MED ORDER — SENNOSIDES 8.6 MG TABLET
ORAL_TABLET | Freq: Every day | ORAL | 0 refills | 30.00 days | Status: CP | PRN
Start: 2023-04-27 — End: ?

## 2023-04-27 MED ORDER — ARIPIPRAZOLE 20 MG TABLET
ORAL_TABLET | Freq: Every evening | ORAL | 0 refills | 30.00 days | Status: CP
Start: 2023-04-27 — End: 2023-05-27

## 2023-04-27 MED ORDER — NARATRIPTAN 2.5 MG TABLET
ORAL_TABLET | Freq: Two times a day (BID) | ORAL | 0 refills | 5.00 days | Status: CP | PRN
Start: 2023-04-27 — End: ?
  Filled 2023-04-27: qty 9, 5d supply, fill #0

## 2023-04-28 ENCOUNTER — Inpatient Hospital Stay: Admit: 2023-04-28 | Payer: MEDICARE

## 2023-05-03 ENCOUNTER — Ambulatory Visit
Admit: 2023-05-03 | Payer: MEDICARE | Attending: Student in an Organized Health Care Education/Training Program | Primary: Student in an Organized Health Care Education/Training Program

## 2023-05-03 ENCOUNTER — Ambulatory Visit: Admit: 2023-05-03 | Payer: MEDICARE

## 2023-05-14 ENCOUNTER — Encounter
Admit: 2023-05-14 | Discharge: 2023-05-15 | Payer: MEDICARE | Attending: Student in an Organized Health Care Education/Training Program | Primary: Student in an Organized Health Care Education/Training Program

## 2023-05-14 DIAGNOSIS — M5441 Lumbago with sciatica, right side: Principal | ICD-10-CM

## 2023-05-14 DIAGNOSIS — G8929 Other chronic pain: Principal | ICD-10-CM

## 2023-05-14 DIAGNOSIS — M5442 Lumbago with sciatica, left side: Principal | ICD-10-CM

## 2023-05-14 MED ORDER — BUPRENORPHINE 2 MG-NALOXONE 0.5 MG SUBLINGUAL TABLET
ORAL_TABLET | Freq: Three times a day (TID) | SUBLINGUAL | 0 refills | 14.00 days | Status: CP | PRN
Start: 2023-05-14 — End: 2023-05-28

## 2023-05-16 ENCOUNTER — Encounter
Admit: 2023-05-16 | Discharge: 2023-05-17 | Payer: MEDICARE | Attending: Student in an Organized Health Care Education/Training Program | Primary: Student in an Organized Health Care Education/Training Program

## 2023-05-16 DIAGNOSIS — M5442 Lumbago with sciatica, left side: Principal | ICD-10-CM

## 2023-05-16 DIAGNOSIS — G8929 Other chronic pain: Principal | ICD-10-CM

## 2023-05-16 DIAGNOSIS — M5441 Lumbago with sciatica, right side: Principal | ICD-10-CM

## 2023-06-14 ENCOUNTER — Ambulatory Visit
Admit: 2023-06-14 | Discharge: 2023-06-15 | Payer: Medicare (Managed Care) | Attending: Student in an Organized Health Care Education/Training Program | Primary: Student in an Organized Health Care Education/Training Program

## 2023-06-14 DIAGNOSIS — G43701 Chronic migraine without aura, not intractable, with status migrainosus: Principal | ICD-10-CM

## 2023-06-14 MED ORDER — PREDNISONE 20 MG TABLET
ORAL_TABLET | Freq: Every day | ORAL | 0 refills | 5.00 days | Status: CP
Start: 2023-06-14 — End: 2023-06-19

## 2023-06-19 ENCOUNTER — Emergency Department
Admit: 2023-06-19 | Discharge: 2023-06-19 | Disposition: A | Discharge status: Home / Self Care | Payer: Medicare (Managed Care) | Attending: Student in an Organized Health Care Education/Training Program

## 2023-06-19 ENCOUNTER — Ambulatory Visit: Admit: 2023-06-19 | Discharge: 2023-06-20 | Payer: Medicare (Managed Care)

## 2023-06-19 DIAGNOSIS — G4489 Other headache syndrome: Principal | ICD-10-CM

## 2023-06-19 DIAGNOSIS — Z9884 Bariatric surgery status: Principal | ICD-10-CM

## 2023-06-19 DIAGNOSIS — R1032 Left lower quadrant pain: Principal | ICD-10-CM

## 2023-06-19 DIAGNOSIS — R109 Unspecified abdominal pain: Principal | ICD-10-CM

## 2023-06-19 DIAGNOSIS — R112 Nausea with vomiting, unspecified: Principal | ICD-10-CM

## 2023-06-20 DIAGNOSIS — R112 Nausea with vomiting, unspecified: Principal | ICD-10-CM

## 2023-06-20 DIAGNOSIS — R109 Unspecified abdominal pain: Principal | ICD-10-CM

## 2023-06-20 DIAGNOSIS — Z9884 Bariatric surgery status: Principal | ICD-10-CM

## 2023-06-21 ENCOUNTER — Ambulatory Visit: Admit: 2023-06-21 | Payer: Medicare (Managed Care) | Attending: Family Medicine | Primary: Family Medicine

## 2023-06-22 ENCOUNTER — Ambulatory Visit: Admit: 2023-06-22 | Payer: Medicare (Managed Care)

## 2023-07-16 ENCOUNTER — Encounter: Admit: 2023-07-16 | Discharge: 2023-07-17 | Payer: Medicare (Managed Care)

## 2023-07-19 DIAGNOSIS — E11649 Type 2 diabetes mellitus with hypoglycemia without coma: Principal | ICD-10-CM

## 2023-07-19 DIAGNOSIS — Z9884 Bariatric surgery status: Principal | ICD-10-CM

## 2023-07-19 DIAGNOSIS — E162 Hypoglycemia, unspecified: Principal | ICD-10-CM

## 2023-07-30 ENCOUNTER — Ambulatory Visit: Admit: 2023-07-30 | Discharge: 2023-07-30 | Payer: Medicare (Managed Care)

## 2023-07-30 ENCOUNTER — Encounter: Admit: 2023-07-30 | Discharge: 2023-07-30 | Payer: Medicare (Managed Care)

## 2023-07-30 DIAGNOSIS — R490 Dysphonia: Principal | ICD-10-CM

## 2023-07-30 DIAGNOSIS — B349 Viral infection, unspecified: Principal | ICD-10-CM

## 2023-07-30 DIAGNOSIS — E11649 Type 2 diabetes mellitus with hypoglycemia without coma: Principal | ICD-10-CM

## 2023-07-30 DIAGNOSIS — Z9884 Bariatric surgery status: Principal | ICD-10-CM

## 2023-07-30 DIAGNOSIS — E162 Hypoglycemia, unspecified: Principal | ICD-10-CM

## 2023-07-30 MED ORDER — ARIPIPRAZOLE 20 MG TABLET
ORAL_TABLET | Freq: Every evening | ORAL | 0 refills | 30.00000 days | Status: CP
Start: 2023-07-30 — End: 2023-08-29
  Filled 2023-08-03: qty 30, 30d supply, fill #0

## 2023-07-30 MED ORDER — DEXCOM G7 SENSOR DEVICE
11 refills | 0.00000 days | Status: CP
Start: 2023-07-30 — End: 2024-07-29

## 2023-07-30 MED ORDER — BAQSIMI 3 MG/ACTUATION NASAL SPRAY
Freq: Once | NASAL | 0 refills | 0.00000 days | Status: CP | PRN
Start: 2023-07-30 — End: ?
  Filled 2023-08-03: qty 2, 1d supply, fill #0

## 2023-07-30 MED ORDER — TIZANIDINE 2 MG TABLET
ORAL_TABLET | Freq: Four times a day (QID) | ORAL | 0 refills | 8.00000 days | Status: CP | PRN
Start: 2023-07-30 — End: ?
  Filled 2023-08-03: qty 30, 8d supply, fill #0

## 2023-08-02 ENCOUNTER — Ambulatory Visit: Admit: 2023-08-02 | Discharge: 2023-08-03 | Payer: Medicare (Managed Care)

## 2023-08-02 DIAGNOSIS — E162 Hypoglycemia, unspecified: Principal | ICD-10-CM

## 2023-08-02 MED ORDER — BLOOD-GLUCOSE METER KIT WRAPPER
ORAL | 11 refills | 0.00000 days | Status: CP
Start: 2023-08-02 — End: 2024-08-01

## 2023-08-02 MED ORDER — BLOOD GLUCOSE TEST STRIPS
ORAL_STRIP | ORAL | 11 refills | 0.00000 days | Status: CP
Start: 2023-08-02 — End: 2024-08-01

## 2023-08-07 ENCOUNTER — Emergency Department
Admit: 2023-08-07 | Discharge: 2023-08-07 | Disposition: A | Discharge status: Home / Self Care | Payer: Medicare (Managed Care)

## 2023-08-07 DIAGNOSIS — R1084 Generalized abdominal pain: Principal | ICD-10-CM

## 2023-08-07 DIAGNOSIS — R110 Nausea: Principal | ICD-10-CM

## 2023-08-21 DIAGNOSIS — M5442 Lumbago with sciatica, left side: Principal | ICD-10-CM

## 2023-08-21 DIAGNOSIS — M5441 Lumbago with sciatica, right side: Principal | ICD-10-CM

## 2023-08-21 MED ORDER — PREDNISONE 20 MG TABLET
ORAL_TABLET | Freq: Every day | ORAL | 0 refills | 5.00000 days | Status: CP
Start: 2023-08-21 — End: 2023-08-26

## 2023-12-18 ENCOUNTER — Encounter
Admit: 2023-12-18 | Discharge: 2023-12-18 | Payer: Medicare (Managed Care) | Attending: Family Medicine | Primary: Family Medicine

## 2023-12-18 DIAGNOSIS — D489 Neoplasm of uncertain behavior, unspecified: Principal | ICD-10-CM

## 2023-12-18 DIAGNOSIS — E119 Type 2 diabetes mellitus without complications: Principal | ICD-10-CM

## 2023-12-18 DIAGNOSIS — F3181 Bipolar II disorder: Principal | ICD-10-CM

## 2023-12-18 MED ORDER — SEMAGLUTIDE 1 MG/DOSE (4 MG/3 ML) SUBCUTANEOUS PEN INJECTOR
SUBCUTANEOUS | 2 refills | 28.00000 days | Status: CP
Start: 2023-12-18 — End: 2024-03-17

## 2024-03-03 DIAGNOSIS — K5900 Constipation, unspecified: Principal | ICD-10-CM

## 2024-03-21 ENCOUNTER — Emergency Department: Admit: 2024-03-21 | Discharge: 2024-03-21 | Disposition: A | Discharge status: Home / Self Care

## 2024-03-21 DIAGNOSIS — R1084 Generalized abdominal pain: Principal | ICD-10-CM
